# Patient Record
Sex: Male | Born: 1945 | Race: White | Hispanic: No | Marital: Single | Smoking: Former smoker
Health system: Southern US, Community
[De-identification: ages and names within clinical notes are randomized; demographics above are authoritative.]

## PROBLEM LIST (undated history)

## (undated) DIAGNOSIS — K219 Gastro-esophageal reflux disease without esophagitis: Secondary | ICD-10-CM

## (undated) DIAGNOSIS — F419 Anxiety disorder, unspecified: Secondary | ICD-10-CM

## (undated) DIAGNOSIS — E785 Hyperlipidemia, unspecified: Secondary | ICD-10-CM

## (undated) DIAGNOSIS — F319 Bipolar disorder, unspecified: Secondary | ICD-10-CM

## (undated) DIAGNOSIS — I509 Heart failure, unspecified: Secondary | ICD-10-CM

## (undated) DIAGNOSIS — G309 Alzheimer's disease, unspecified: Secondary | ICD-10-CM

## (undated) DIAGNOSIS — F0280 Dementia in other diseases classified elsewhere without behavioral disturbance: Secondary | ICD-10-CM

## (undated) DIAGNOSIS — E119 Type 2 diabetes mellitus without complications: Secondary | ICD-10-CM

## (undated) DIAGNOSIS — I10 Essential (primary) hypertension: Secondary | ICD-10-CM

---

## 2014-01-06 ENCOUNTER — Emergency Department (HOSPITAL_COMMUNITY)
Admission: EM | Admit: 2014-01-06 | Discharge: 2014-01-09 | Disposition: A | Discharge status: Other Acute Inpatient Hospital | Payer: Medicare Other | Attending: Emergency Medicine | Admitting: Emergency Medicine

## 2014-01-06 ENCOUNTER — Emergency Department (HOSPITAL_COMMUNITY): Payer: Medicare Other

## 2014-01-06 ENCOUNTER — Encounter (HOSPITAL_COMMUNITY): Payer: Self-pay | Admitting: *Deleted

## 2014-01-06 DIAGNOSIS — G309 Alzheimer's disease, unspecified: Secondary | ICD-10-CM | POA: Insufficient documentation

## 2014-01-06 DIAGNOSIS — K219 Gastro-esophageal reflux disease without esophagitis: Secondary | ICD-10-CM | POA: Insufficient documentation

## 2014-01-06 DIAGNOSIS — I10 Essential (primary) hypertension: Secondary | ICD-10-CM | POA: Diagnosis not present

## 2014-01-06 DIAGNOSIS — F419 Anxiety disorder, unspecified: Secondary | ICD-10-CM | POA: Insufficient documentation

## 2014-01-06 DIAGNOSIS — Z008 Encounter for other general examination: Secondary | ICD-10-CM

## 2014-01-06 DIAGNOSIS — E785 Hyperlipidemia, unspecified: Secondary | ICD-10-CM | POA: Diagnosis not present

## 2014-01-06 DIAGNOSIS — Z79899 Other long term (current) drug therapy: Secondary | ICD-10-CM | POA: Insufficient documentation

## 2014-01-06 DIAGNOSIS — Z7902 Long term (current) use of antithrombotics/antiplatelets: Secondary | ICD-10-CM | POA: Diagnosis not present

## 2014-01-06 DIAGNOSIS — Z7982 Long term (current) use of aspirin: Secondary | ICD-10-CM | POA: Insufficient documentation

## 2014-01-06 DIAGNOSIS — Z87891 Personal history of nicotine dependence: Secondary | ICD-10-CM | POA: Insufficient documentation

## 2014-01-06 DIAGNOSIS — E119 Type 2 diabetes mellitus without complications: Secondary | ICD-10-CM | POA: Diagnosis not present

## 2014-01-06 DIAGNOSIS — I509 Heart failure, unspecified: Secondary | ICD-10-CM | POA: Insufficient documentation

## 2014-01-06 DIAGNOSIS — F0280 Dementia in other diseases classified elsewhere without behavioral disturbance: Secondary | ICD-10-CM | POA: Diagnosis present

## 2014-01-06 DIAGNOSIS — R45851 Suicidal ideations: Secondary | ICD-10-CM | POA: Diagnosis present

## 2014-01-06 DIAGNOSIS — Q349 Congenital malformation of respiratory system, unspecified: Secondary | ICD-10-CM

## 2014-01-06 HISTORY — DX: Dementia in other diseases classified elsewhere, unspecified severity, without behavioral disturbance, psychotic disturbance, mood disturbance, and anxiety: F02.80

## 2014-01-06 HISTORY — DX: Hyperlipidemia, unspecified: E78.5

## 2014-01-06 HISTORY — DX: Type 2 diabetes mellitus without complications: E11.9

## 2014-01-06 HISTORY — DX: Anxiety disorder, unspecified: F41.9

## 2014-01-06 HISTORY — DX: Alzheimer's disease, unspecified: G30.9

## 2014-01-06 HISTORY — DX: Essential (primary) hypertension: I10

## 2014-01-06 HISTORY — DX: Gastro-esophageal reflux disease without esophagitis: K21.9

## 2014-01-06 HISTORY — DX: Heart failure, unspecified: I50.9

## 2014-01-06 LAB — CBC WITH DIFFERENTIAL/PLATELET
BASOS PCT: 0 % (ref 0–1)
Basophils Absolute: 0 10*3/uL (ref 0.0–0.1)
Eosinophils Absolute: 0.1 10*3/uL (ref 0.0–0.7)
Eosinophils Relative: 3 % (ref 0–5)
HCT: 33.5 % — ABNORMAL LOW (ref 39.0–52.0)
HEMOGLOBIN: 11.3 g/dL — AB (ref 13.0–17.0)
Lymphocytes Relative: 40 % (ref 12–46)
Lymphs Abs: 1.8 10*3/uL (ref 0.7–4.0)
MCH: 30.5 pg (ref 26.0–34.0)
MCHC: 33.7 g/dL (ref 30.0–36.0)
MCV: 90.5 fL (ref 78.0–100.0)
MONO ABS: 0.3 10*3/uL (ref 0.1–1.0)
MONOS PCT: 7 % (ref 3–12)
NEUTROS PCT: 50 % (ref 43–77)
Neutro Abs: 2.3 10*3/uL (ref 1.7–7.7)
PLATELETS: 82 10*3/uL — AB (ref 150–400)
RBC: 3.7 MIL/uL — ABNORMAL LOW (ref 4.22–5.81)
RDW: 14 % (ref 11.5–15.5)
WBC: 4.5 10*3/uL (ref 4.0–10.5)

## 2014-01-06 LAB — COMPREHENSIVE METABOLIC PANEL
ALT: 14 U/L (ref 0–53)
AST: 30 U/L (ref 0–37)
Albumin: 4.1 g/dL (ref 3.5–5.2)
Alkaline Phosphatase: 76 U/L (ref 39–117)
Anion gap: 9 (ref 5–15)
BUN: 16 mg/dL (ref 6–23)
CO2: 25 mmol/L (ref 19–32)
Calcium: 9.5 mg/dL (ref 8.4–10.5)
Chloride: 107 mEq/L (ref 96–112)
Creatinine, Ser: 0.9 mg/dL (ref 0.50–1.35)
GFR, EST NON AFRICAN AMERICAN: 86 mL/min — AB (ref >90)
Glucose, Bld: 83 mg/dL (ref 70–99)
Potassium: 3.8 mmol/L (ref 3.5–5.1)
SODIUM: 141 mmol/L (ref 135–145)
Total Bilirubin: 0.7 mg/dL (ref 0.3–1.2)
Total Protein: 7.2 g/dL (ref 6.0–8.3)

## 2014-01-06 LAB — RAPID URINE DRUG SCREEN, HOSP PERFORMED
Amphetamines: NOT DETECTED
BENZODIAZEPINES: POSITIVE — AB
Barbiturates: NOT DETECTED
Cocaine: NOT DETECTED
OPIATES: NOT DETECTED
Tetrahydrocannabinol: NOT DETECTED

## 2014-01-06 LAB — ETHANOL: Alcohol, Ethyl (B): <5 mg/dL (ref 0–9)

## 2014-01-06 MED ORDER — LORAZEPAM 1 MG PO TABS
2.0000 mg | ORAL_TABLET | Freq: Once | ORAL | Status: AC
  Administered 2014-01-06: 2 mg via ORAL
  Filled 2014-01-06: qty 2

## 2014-01-06 NOTE — ED Notes (Signed)
"  I just don't think its necessary. These tests are where you make your money" per patient when asked if he would be willing to let this writer preform an EKG.

## 2014-01-06 NOTE — BH Assessment (Addendum)
Tele Assessment Note  Justin FriskJohnny Howard is an 68 y.o. divorced male [facility] with [facility]. [facility]. However, the pt has no insight into his illness. His mood are usually labile throughout the assessment, alternating between happy, angry, and irritable. He also has a long hx of alcohol abuse but reports that he has not drunk since he [facility]. He is dressed in scrubs and maintains eye contact. Pt is very open and talks excessively about how much he hates his current residential placement and will hurt himself if he has to continue living there. He threatened to break a window and cut himself with a shard of glass. Pt was also quite upset about having a Power of Attorney, whom he states is trying to steal his half million dollars from him. Pt also discussed properties he states that he owns and says his peers at the [facility] are jealous of his wealth. There is a possibility that the pt is having grandiose delusions but TTS Counselor had no way corroborate his stories since the doctor at [facility] knew nothing about the pt's episode and none of his relatives could be reached by phone. Pt denies any A/VH or delusions and believes that he is stuck in a home "with crazy people" [facility]. No hx of past suicide attempts. No reported previous mental health treatment. Pt does acknowledge that his current living situation has led to some depression and continues to state that he cannot take living there any longer.  Primary: 331.0 Alzheimer's disease                309.0 Adjustment Disorder w/ Depressed Mood Axis II: No diagnosis Axis III: HBP, GERD, Hyperlipidemia, CHF Axis IV: housing problems, occupational problems, problems related to social environment and problems with primary support group Axis V: GAF= 35  Past Medical History:  Past Medical History  Diagnosis Date  . Hypertension   . Diabetes  mellitus without complication   . GERD (gastroesophageal reflux disease)   . Hyperlipidemia   . CHF (congestive heart failure)   . Alzheimer disease   . Anxiety     History reviewed. No pertinent past surgical history.  Family History: History reviewed. No pertinent family history.  Social History:  reports that he has quit smoking. He does not have any smokeless tobacco history on file. His alcohol and drug histories are not on file.  Additional Social History:  Alcohol / Drug Use Pain Medications: See med list Prescriptions: See med list Over the Counter: See med list History of alcohol / drug use?: Yes Longest period of sobriety (when/how long): "Many years...plus, I haven't had a single drink since I got locked up in this facility in 2014" Negative Consequences of Use:  (Denies) Withdrawal Symptoms:  (Denies) Substance #1 Name of Substance 1: Alcohol 1 - Age of First Use: 36 1 - Amount (size/oz): 1/2 bottle of Vodka 1 - Frequency: Daily for a few years; intermittently throughout other years 1 - Duration: 30 years 1 - Last Use / Amount: 2014 before [facility]  CIWA: CIWA-Ar BP: (!) 138/52 mmHg Pulse Rate: 63 COWS:    PATIENT STRENGTHS: (choose at least two) Communication skills Motivation for treatment/growth Religious Affiliation Supportive family/friends Work skills  Allergies: No Known Allergies  Home Medications:  (Not in a hospital admission)  OB/GYN Status:  No LMP for male patient.  General Assessment Data Location of Assessment: [name] Is this a Tele or Face-to-Face Assessment?: Face-to-Face Is this an Initial Assessment or a Re-assessment for this encounter?: Initial Assessment Living Arrangements: Other (Comment) [facility] [place] - [facility]) Can pt return to current living arrangement?: Yes (But says he will committ suicide if he has to) Admission Status: Voluntary Is patient capable of signing voluntary  admission?: Yes Transfer from: Nsg Home Referral Source: Other [facility] )     [facility] Living Arrangements: Other (Comment) [facility] Name of Psychiatrist: Audree Baneeter King Name of Therapist: None reported  Education Status Is patient currently in school?: No Current Grade: na Highest grade of school patient has completed: college Name of school: na Contact person: na  Risk to self with the past 6 months Suicidal Ideation: Yes-Currently Present Suicidal Intent: Yes-Currently Present Is patient at risk for suicide?: Yes Suicidal Plan?: Yes-Currently Present Specify Current Suicidal Plan: Cutting self w/glass, strangling self, etc. Access to Means: Yes Specify Access to Suicidal Means: Plans to break window for glass What has been your use of drugs/alcohol within the last 12 months?: Endorses drinking etoh 1x in2014 Previous Attempts/Gestures: No How many times?: 0 Other Self Harm Risks: None noted Triggers for Past Attempts: Other (Comment) (Trigger for current SI is wanting to [facility]) Intentional Self Injurious Behavior: None Family Suicide History: No Recent stressful life event(s): Loss (Comment) (Loss of freedom, loss of son 2 yrs ago) Persecutory voices/beliefs?: No Depression: Yes Depression Symptoms: Despondent, Insomnia, Tearfulness, Isolating, Feeling angry/irritable Substance abuse history and/or treatment for substance abuse?: Yes Suicide prevention information given to non-admitted patients: Yes  Risk to Others within the past 6 months Homicidal Ideation: No Thoughts of Harm to Others: No-Not Currently Present/Within Last 6 Months (Records state [facility]) Current Homicidal Intent: No Current Homicidal Plan: No Access to Homicidal Means: No Identified Victim: na History of harm to others?: Yes (Pt denies;pt chart [facility] Assessment of  Violence: None Noted Violent Behavior Description: None noted Does patient have access to weapons?: No Criminal Charges Pending?: No Does patient have a court date: No  Psychosis Hallucinations: None noted Delusions: Grandiose (Pt reports having millions owed to him; unsure if delusional)  Mental Status Report Appear/Hygiene: Unremarkable, In scrubs Eye Contact: Good Motor Activity: Freedom of movement Speech: Argumentative, Logical/coherent, Tangential Level of Consciousness: Quiet/awake, Irritable Mood: Other (Comment) (Labile: pleasant, irritable, argumentative, happy.Marland Kitchen.) Affect: Labile Anxiety Level: Minimal Thought Processes: Tangential Judgement: Partial Orientation: Person, Place, Situation Obsessive Compulsive Thoughts/Behaviors: Moderate (About money owed to him)  Cognitive Functioning Concentration: Fair Memory: Recent Impaired, Remote Impaired IQ: Average Insight: Poor Impulse Control: Fair Appetite: Fair Weight Loss: 0 Weight Gain: 10 Sleep: Decreased Total Hours of Sleep: 5 Vegetative Symptoms: Staying in bed  ADLScreening [facility] Assessment Services) Patient's cognitive ability adequate to safely complete daily activities?: No (Per residential staff; pt does not agree) Patient able to express need for assistance with ADLs?: Yes Independently performs ADLs?: No (Pt disagrees, but residential care home staff say he needs assistance w/ ADLs)  Prior Inpatient Therapy Prior Inpatient Therapy: Yes Prior Therapy Dates: Pt cannot recall Prior Therapy [place] Reason for Treatment: Dementia/Alzheimers  Prior Outpatient Therapy Prior Outpatient Therapy: No Prior Therapy Dates: Pt unsure Prior Therapy Facilty/Provider(s): Pt unsure Reason for Treatment: na  ADL Screening (condition at time of admission) Patient's cognitive ability adequate  to safely complete daily activities?: No (Per residential staff; pt  does not agree) Is the patient deaf or have difficulty hearing?: No Does the patient have difficulty seeing, even when wearing glasses/contacts?: No Does the patient have difficulty concentrating, remembering, or making decisions?: No Patient able to express need for assistance with ADLs?: Yes Does the patient have difficulty dressing or bathing?: No Independently performs ADLs?: No (Pt disagrees, but residential care home staff say he needs assistance w/ ADLs) Does the patient have difficulty walking or climbing stairs?: No Weakness of Legs: None Weakness of Arms/Hands: None  Home Assistive Devices/Equipment Home Assistive Devices/Equipment: None    Abuse/Neglect Assessment (Assessment to be complete while patient is alone) Physical Abuse: Denies Verbal Abuse: Denies Sexual Abuse: Denies Exploitation of patient/patient's resources: Yes, present (Comment) (Pt believes his POA is exploiting him out of his funds; Social Work involved ) Self-Neglect: Denies Possible abuse reported to:: [employer] Social Work Values / Beliefs Cultural Requests During Hospitalization: None Spiritual Requests During Hospitalization: None   Merchant navy officer (For Healthcare) Does patient have an advance directive?: No Would patient like information on creating an advanced directive?: No - patient declined information    Additional Information 1:1 In Past 12 Months?: No CIRT Risk: No Elopement Risk: No Does patient have medical clearance?: No     Disposition: Per Verne Spurr, PA pt meets inpt criteria. TTS to seek gero-psych placement, preferably in [place]. Social work also involved, as pt deemed to be able to live at a lower level of care than his current lock-down facility.   Disposition Initial Assessment Completed for this Encounter: Yes Disposition of Patient: Inpatient treatment program, Referred to Type of inpatient treatment program: Adult Patient referred to: Other (Comment)  ([facility] [place] recommended by Verne Spurr, PA)  Cyndie Mull, [facility] Triage Specialist  01/06/2014 10:43 PM

## 2014-01-06 NOTE — BHH Counselor (Signed)
Per Verne SpurrNeil Mashburn, PA, pt meets criteria for inpt treatment. TTS will seek placement at gero-psych facility, with PA's recommendation being [facility]. TTS Counselor will fax pt's clinical info packet/referral asap by early next morning (12/31).  TTS Counselor also discussed pt status with pt's RN, Gearldine BienenstockBrandy. Counselor informed RN of disposition, which can be shared with the patient at this time. [facility] until he can be transferred to gero-psych facility hopefully soon.      Cyndie MullAnna Summit Arroyave, MS, [facility] Triage Specialist

## 2014-01-06 NOTE — ED Notes (Signed)
Staffing office just notified by me for need for sitter.

## 2014-01-06 NOTE — BHH Counselor (Signed)
Pt completed behavioral health assessment with pt and then called [facility] to gain collateral information. Dr. Theron AristaPeter King's contact info was listed in Epic, however he could not provide much information about the incident. He read from pt's chart that "he [patient] became aggressive and threatened to kill himself" and was then transported to ED.    Cyndie MullAnna [place], [facility] Triage Specialist

## 2014-01-06 NOTE — ED Notes (Signed)
Per Tobi BastosAnna, TTS, patient's information will be faxed [facility] tonight for possible admission tomorrow.

## 2014-01-06 NOTE — ED Notes (Signed)
Bed: [id number] Expected date:  Expected time:  Means of arrival:  Comments: EMS elderly/ag[place]ion

## 2014-01-06 NOTE — ED Provider Notes (Signed)
CSN: [id number]     Arrival date & time 01/06/14  1410 History   First MD Initiated Contact with Patient 01/06/14 1419     Chief Complaint  Patient presents with  . Suicidal     HPI Patient presents to the emergency department from a memory care unit.  He states that he does not feel like he should be varices take care of his normal activities of daily living.  It's reported that today he threatened suicide by stating that he would break a window and cut himself.  Patient states this all worsened after an MI with out of hospital cardiac arrest one year ago.    Past Medical History  Diagnosis Date  . Hypertension   . Diabetes mellitus without complication   . GERD (gastroesophageal reflux disease)   . Hyperlipidemia   . CHF (congestive heart failure)   . Alzheimer disease   . Anxiety    History reviewed. No pertinent past surgical history. History reviewed. No pertinent family history. History  Substance Use Topics  . Smoking status: Former Games developermoker  . Smokeless tobacco: Not on file  . Alcohol Use: Not on file    Review of Systems  All other systems reviewed and are negative.     Allergies  Review of patient's allergies indicates no known allergies.  Home Medications   Prior to Admission medications   Medication Sig Start Date End Date Taking? Authorizing Provider  acetaminophen (TYLENOL) 325 MG tablet Take 650 mg by mouth every 6 (six) hours as needed for moderate pain.   Yes Historical Provider, MD  aspirin 81 MG tablet Take 81 mg by mouth daily.   Yes Historical Provider, MD  clopidogrel (PLAVIX) 75 MG tablet Take 75 mg by mouth daily.   Yes Historical Provider, MD  DOXAZOSIN MESYLATE PO Take 4 mg by mouth daily.   Yes Historical Provider, MD  ferrous sulfate 325 (65 FE) MG tablet Take 325 mg by mouth daily with breakfast.   Yes Historical Provider, MD  fluticasone (FLONASE) 50 MCG/ACT nasal spray Place 1 spray into both nostrils daily.   Yes Historical Provider, MD   loratadine (CLARITIN) 10 MG tablet Take 10 mg by mouth at bedtime and may repeat dose one time if needed.   Yes Historical Provider, MD  LORazepam (ATIVAN) 1 MG tablet Take 1 mg by mouth every 8 (eight) hours as needed for anxiety (anxiety).   Yes Historical Provider, MD  metFORMIN (GLUCOPHAGE) 500 MG tablet Take 500 mg by mouth 2 (two) times daily with a meal.   Yes Historical Provider, MD  metoprolol (LOPRESSOR) 50 MG tablet Take 50 mg by mouth 2 (two) times daily.   Yes Historical Provider, MD  nitroGLYCERIN (NITROSTAT) 0.4 MG SL tablet Place 0.4 mg under the tongue every 5 (five) minutes as needed for chest pain. For 3 Doses.   Yes Historical Provider, MD  omeprazole (PRILOSEC) 20 MG capsule Take 20 mg by mouth at bedtime.   Yes Historical Provider, MD  pravastatin (PRAVACHOL) 40 MG tablet Take 40 mg by mouth daily.   Yes Historical Provider, MD  QUEtiapine Fumarate (SEROQUEL XR) 150 MG 24 hr tablet Take 150 mg by mouth at bedtime.   Yes Historical Provider, MD  thiamine (VITAMIN B-1) 100 MG tablet Take 100 mg by mouth daily.   Yes Historical Provider, MD   BP 158/77 mmHg  Pulse 55  Temp(Src) 97.7 F (36.5 C) (Oral)  Resp 20  SpO2 96% Physical Exam  Constitutional:  He is oriented to person, place, and time. He appears well-developed and well-nourished.  HENT:  Head: Normocephalic and atraumatic.  Eyes: EOM are normal.  Neck: Normal range of motion.  Cardiovascular: Normal rate, regular rhythm, normal heart sounds and intact distal pulses.   Pulmonary/Chest: Effort normal and breath sounds normal. No respiratory distress.  Abdominal: Soft. He exhibits no distension. There is no tenderness.  Musculoskeletal: Normal range of motion.  Neurological: He is alert and oriented to person, place, and time.  Skin: Skin is warm and dry.  Psychiatric: He has a normal mood and affect. Judgment normal.  Nursing note and vitals reviewed.   ED Course  Procedures (including critical care  time) Labs Review Labs Reviewed  CBC WITH DIFFERENTIAL  COMPREHENSIVE METABOLIC PANEL  URINE RAPID DRUG SCREEN (HOSP PERFORMED)  ETHANOL    Imaging Review Dg Chest 1 View  01/06/2014   CLINICAL DATA:  Medical clearance for psychiatric admission.  EXAM: CHEST - 1 VIEW  COMPARISON:  None.  FINDINGS: Mild peribronchial thickening and hyperinflation. Heart and mediastinal contours are within normal limits. No focal opacities or effusions. No acute bony abnormality.  IMPRESSION: Mild bronchitic changes.   Electronically Signed   By: Charlett NoseKevin  Dover M.D.   On: 01/06/2014 16:54  I personally reviewed the imaging tests through PACS system I reviewed available ER/hospitalization records through the EMR    EKG Interpretation None      MDM   Final diagnoses:  Medical clearance for psychiatric admission   Overall I'm not completely convinced the patient needs to be in a full lockdown memory care unit.  I think he would do well in an assisted living unit.  I understand the patient's frustration.  Not sure this is the level of care that he needs.  I don't get a sense that he is truly suicidal at think he is just frustrated with his living situation.  I've asked that he be compliant here in the emergency department and work with us.  I will involve social work as well as case Insurance account managermanagement.  I think he will benefit from a psychiatric consultation to better evaluate his ability to live and in an assisted living facility.    Lyanne CoKevin M Edy Mcbane, MD 01/06/14 [phone]

## 2014-01-06 NOTE — ED Notes (Signed)
Justin Howard, TTS, at bedside speaking with patient.

## 2014-01-06 NOTE — Progress Notes (Signed)
CSW met with patient at bedside. There was no family present. According to patient, he presents to the ED from the locked unit at [facility] because he stated that he could not live "locked up" anymore. Patient states that he feels his quality of life is being decreased by staying in a locked facility. Patient states his ability to work, read books, and enjoy life are being taken. Patient says that staying in a locked facility is causing him to be depressed.  Patient informed CSW that a year ago he was in [place] at a hotel, and haD a heart attack in the parking lot. According to patient, his support system consist of his daughter, sister, and male friend.  Patient stated he was extremely close to his son. However, he died 5 years ago.  Patient states that his current POA is his male friend. However, patient states that he no longer wants the friend to remain his POA.   Patient informed CSW that he feels as though he is competent, and can make decisions for himself. Patient appears to be alert and oriented.  CSW will inform patient about POA documentation.  Willette Brace [phone] ED CSW 01/06/2014 5:56 PM

## 2014-01-06 NOTE — Progress Notes (Signed)
Cm consulted by Dr Patria Maneampos to discuss pt case wanting to get pt to lower level of care.  CM assisted pt to call [employer] [phone] Pt had transpose numbers The correct number is [phone]  Cell [phone]

## 2014-01-06 NOTE — ED Notes (Signed)
Pt. Refused to have blood drawn and nurse is aware.

## 2014-01-06 NOTE — BHH Counselor (Signed)
TTS Counselor called pt's attending physician, Dr. Gwendolyn GrantWalden, whom made TTS Consult referral and informed him that she would begin assessment shortly.      Cyndie MullAnna Rajendra Spiller, MS, [facility] Triage Specialist

## 2014-01-06 NOTE — ED Notes (Signed)
Trying to ask patient to give urine sample. Explained how we would be getting new labs and urine sample, etc despite his recent visit to another hospital. Patient states "theyre going to run up my bill." MD Patria ManeCampos is at bedside currently

## 2014-01-06 NOTE — Progress Notes (Signed)
CSW met with patient at beside. There was no family present. Patient was given documentation about POA. Patient informed CSW that he wishes to change his POA. However, at this current time his POA remains the same.  Patient gave CSW permission to contact his sister. Patient stated that he wanted his sister to appointed as his main [place]. However, the patients sister is not interested in being a POA at this time due to the fact that she lives in [place].  Patient also wanted CSW to speak with his daughter. CSW called daughter, but no one answered the phone.  Patient is pleasant but extremely anxious at this time. Patient appears to be impulsive and continues to walk to the nurses station.  Sister/Vera Dorian Pod [phone] Daughter/Amy Eulas Post [phone] Lennox Solders [phone]  Willette Brace [phone] ED CSW 01/06/2014 11:29 PM
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
## 2014-01-06 NOTE — BH Assessment (Signed)
Writer informed the TTS Tobi Bastosnna of the consult.

## 2014-01-06 NOTE — ED Notes (Signed)
Patient ambulating in hallway.  Alert and pleasant.  No signs of distress noted.

## 2014-01-06 NOTE — ED Notes (Signed)
Social worker at bedside.

## 2014-01-06 NOTE — ED Notes (Signed)
Patient refusing all labs and testing. Convinced [facility] is [facility]Campos aware.

## 2014-01-06 NOTE — ED Notes (Signed)
Patient is becoming increasingly agitated and impulsive.  Dr. Gwendolyn GrantWalden made aware of behavior changes.  Medications administered.

## 2014-01-06 NOTE — ED Notes (Addendum)
[facility] and today he told the nursing staff that he plans to break a window, take the glass and slit his wrist. He states he would rather die that to spend the rest of his life in a alzheimer's unit. He feels like he has been incarcerated at this facility. [facility].

## 2014-01-07 DIAGNOSIS — F0280 Dementia in other diseases classified elsewhere without behavioral disturbance: Secondary | ICD-10-CM | POA: Diagnosis present

## 2014-01-07 DIAGNOSIS — G309 Alzheimer's disease, unspecified: Secondary | ICD-10-CM

## 2014-01-07 LAB — CBG MONITORING, ED: Glucose-Capillary: 161 mg/dL — ABNORMAL HIGH (ref 70–99)

## 2014-01-07 MED ORDER — ASPIRIN 81 MG PO CHEW
81.0000 mg | CHEWABLE_TABLET | Freq: Every day | ORAL | Status: DC
  Administered 2014-01-07 – 2014-01-09 (×3): 81 mg via ORAL
  Filled 2014-01-07 (×3): qty 1

## 2014-01-07 MED ORDER — FERROUS SULFATE 325 (65 FE) MG PO TABS
325.0000 mg | ORAL_TABLET | Freq: Every day | ORAL | Status: DC
  Administered 2014-01-07 – 2014-01-09 (×3): 325 mg via ORAL
  Filled 2014-01-07 (×4): qty 1

## 2014-01-07 MED ORDER — DOXAZOSIN MESYLATE 4 MG PO TABS
4.0000 mg | ORAL_TABLET | Freq: Every day | ORAL | Status: DC
  Administered 2014-01-07 – 2014-01-09 (×3): 4 mg via ORAL
  Filled 2014-01-07 (×3): qty 1

## 2014-01-07 MED ORDER — PRAVASTATIN SODIUM 40 MG PO TABS
40.0000 mg | ORAL_TABLET | Freq: Every day | ORAL | Status: DC
  Administered 2014-01-07 – 2014-01-09 (×3): 40 mg via ORAL
  Filled 2014-01-07 (×3): qty 1

## 2014-01-07 MED ORDER — QUETIAPINE FUMARATE ER 50 MG PO TB24
150.0000 mg | ORAL_TABLET | Freq: Every day | ORAL | Status: DC
  Administered 2014-01-07 – 2014-01-08 (×2): 150 mg via ORAL
  Filled 2014-01-07 (×3): qty 3

## 2014-01-07 MED ORDER — METOPROLOL TARTRATE 25 MG PO TABS
50.0000 mg | ORAL_TABLET | Freq: Two times a day (BID) | ORAL | Status: DC
  Administered 2014-01-07 – 2014-01-09 (×5): 50 mg via ORAL
  Filled 2014-01-07 (×5): qty 2

## 2014-01-07 MED ORDER — LORATADINE 10 MG PO TABS
10.0000 mg | ORAL_TABLET | Freq: Every evening | ORAL | Status: DC | PRN
  Administered 2014-01-07 – 2014-01-08 (×2): 10 mg via ORAL
  Filled 2014-01-07 (×6): qty 1

## 2014-01-07 MED ORDER — FLUTICASONE PROPIONATE 50 MCG/ACT NA SUSP
1.0000 | Freq: Every day | NASAL | Status: DC
  Administered 2014-01-07 – 2014-01-09 (×3): 1 via NASAL
  Filled 2014-01-07: qty 16

## 2014-01-07 MED ORDER — NITROGLYCERIN 0.4 MG SL SUBL: 0.4000 mg | SUBLINGUAL_TABLET | SUBLINGUAL | Status: DC | PRN

## 2014-01-07 MED ORDER — VITAMIN B-1 100 MG PO TABS
100.0000 mg | ORAL_TABLET | Freq: Every day | ORAL | Status: DC
  Administered 2014-01-07 – 2014-01-09 (×3): 100 mg via ORAL
  Filled 2014-01-07 (×3): qty 1

## 2014-01-07 MED ORDER — LORAZEPAM 1 MG PO TABS
1.0000 mg | ORAL_TABLET | Freq: Three times a day (TID) | ORAL | Status: DC | PRN
  Administered 2014-01-07: 1 mg via ORAL
  Filled 2014-01-07 (×2): qty 1

## 2014-01-07 MED ORDER — PANTOPRAZOLE SODIUM 40 MG PO TBEC
40.0000 mg | DELAYED_RELEASE_TABLET | Freq: Every day | ORAL | Status: DC
  Administered 2014-01-07 – 2014-01-09 (×3): 40 mg via ORAL
  Filled 2014-01-07 (×3): qty 1

## 2014-01-07 MED ORDER — CLOPIDOGREL BISULFATE 75 MG PO TABS
75.0000 mg | ORAL_TABLET | Freq: Every day | ORAL | Status: DC
  Administered 2014-01-07 – 2014-01-09 (×3): 75 mg via ORAL
  Filled 2014-01-07 (×3): qty 1

## 2014-01-07 MED ORDER — METFORMIN HCL 500 MG PO TABS
500.0000 mg | ORAL_TABLET | Freq: Two times a day (BID) | ORAL | Status: DC
  Administered 2014-01-07 – 2014-01-09 (×5): 500 mg via ORAL
  Filled 2014-01-07 (×6): qty 1

## 2014-01-07 NOTE — ED Notes (Signed)
Patient was awake briefly, pleasant with no distress noted.  Ambulated to restroom without difficulty.  He requested something to drink, but was asleep within a few minutes of getting back to his room.

## 2014-01-07 NOTE — ED Notes (Signed)
Pt out to nurses station to use phone, calling facility he came from.

## 2014-01-07 NOTE — Progress Notes (Signed)
Patients sister reached out to [facility]. CSW met with patient in room and facilitated a phone call between patient and sister. Patient and sister are curious as to where he will be discharged. Patient was pleasant and seems less anxious than yesterday.  Willette Brace [phone] ED CSW 01/07/2014 11:17 PM

## 2014-01-07 NOTE — BH Assessment (Signed)
BHH Assessment Progress Note  Pt has been referred to the following facilities with results as noted:  *[facility]: referral faxed, decision pending Berton Lan*[facility]: referral faxed, decision pending Joanne Gavel*[facility]: at capacity *[facility]: at capacity [facility]: at capacity [employer]*[facility]: at capacity  Doylene Canninghomas Tivis Wherry, [place] Triage Specialist 01/07/2014 @ 16:51

## 2014-01-07 NOTE — ED Notes (Signed)
Bed: WA27 Expected date:  Expected time:  Means of arrival:  Comments: Claudette LawsWatson

## 2014-01-07 NOTE — Progress Notes (Signed)
01/07/14 1610 ED CM spoke with pt.  He was sitting on his bed quietly but had various questions about his progression.  Pt inquired about different levels of care Pt initially did not recognize Cm until she came closer to him.  Then he inquired about the difference between CM and SW jobs Pt called SW [name] by name without being reminded of her name by CM.  Pt states he spoke with his lawyer today Unconfirmed by CM.  Did not confirm if he has spoken with [name].  ED Cm and SW spoke and discussed call to pt's sister on 01/06/14 for collateral information.  Refer to ED SW note from 01/06/14.  Pt voiced interest in having "intellectual" things to read like "forbes", "wall [employer]"  Reports being use to his "laptop"  Pt noted confused about a "level one here" ([name]) and [facility].  Explained to pt that disposition is pending a bed offer from an inpatient or geriatric facility as  re[facility] after evaluating him 01/07/14 Pt voiced understanding. CM discussed with [name], pt request to have something to read or do "intellectually"

## 2014-01-07 NOTE — ED Notes (Signed)
Pt out to nurses station to use phone. Pt called his facility and his attorney. Was stating on the phone that he is so locked up at the facility that he" doesn't want to live anymore, I didn't say the word "cut" but that's my plan." Pt is focused on his POA status and wanting SW here to change his medical and financial POA as he feels his current POA and girlfriend is only after his money and feels as though he can makes medical decisions for himself. Sts he has never been declared incompetent or incapacitated. Pt sts he wants the psychiatrist rounding this am to speak with his attorney by 12pm regarding POA. Pt advised that the ED's primary goal is to determine if the pt is safe to be discharged back to [facility], or if he requires inpt treatment. Pt frustrated, focusing on POA issues again, but acknowledges plan of care and agreement. Pt back to room with sitter.

## 2014-01-07 NOTE — ED Notes (Signed)
TTS at bedside. 

## 2014-01-07 NOTE — ED Notes (Signed)
Patient is finally asleep, will report off to on coming tech that vitals need to be done when patient wakes up. RN notified and aware.

## 2014-01-07 NOTE — ED Notes (Signed)
TTS AT BS

## 2014-01-07 NOTE — Progress Notes (Signed)
ED CM received a call left by Bartholomew BoardsAnn Thornley [phone]) on 01/07/14 at 1119 Stating she appreciating Cm speaking with her and son on 01/06/14 and wanted to know if Cm needed her to fax the [facility]iatric report [facility] [address] [place] Stated CM would be able to reached via her cell [phone]  [phone] Cm returned a call [facility] to [facility] and informed her that the [facility] did not request the report and have evaluated pt.  She informed CM the Dr who saw the pt was Dr Sherral Hammersobbins She asked how the pt was doing and was informed that the last time CM saw pt he was doing well and eating his lunch.  Dewayne Hatchnn stated she just wanted to know how pt was doing CM did not offer further information
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
## 2014-01-07 NOTE — ED Notes (Addendum)
Pt requests Ativan to be given around 9 pm so he can sleep better.

## 2014-01-08 DIAGNOSIS — Z79899 Other long term (current) drug therapy: Secondary | ICD-10-CM | POA: Diagnosis not present

## 2014-01-08 DIAGNOSIS — K219 Gastro-esophageal reflux disease without esophagitis: Secondary | ICD-10-CM | POA: Diagnosis not present

## 2014-01-08 DIAGNOSIS — G309 Alzheimer's disease, unspecified: Secondary | ICD-10-CM | POA: Diagnosis not present

## 2014-01-08 DIAGNOSIS — F419 Anxiety disorder, unspecified: Secondary | ICD-10-CM | POA: Diagnosis not present

## 2014-01-08 DIAGNOSIS — E119 Type 2 diabetes mellitus without complications: Secondary | ICD-10-CM | POA: Diagnosis not present

## 2014-01-08 DIAGNOSIS — F0280 Dementia in other diseases classified elsewhere without behavioral disturbance: Secondary | ICD-10-CM | POA: Diagnosis not present

## 2014-01-08 DIAGNOSIS — I509 Heart failure, unspecified: Secondary | ICD-10-CM | POA: Diagnosis not present

## 2014-01-08 DIAGNOSIS — Z7902 Long term (current) use of antithrombotics/antiplatelets: Secondary | ICD-10-CM | POA: Diagnosis not present

## 2014-01-08 DIAGNOSIS — Z87891 Personal history of nicotine dependence: Secondary | ICD-10-CM | POA: Diagnosis not present

## 2014-01-08 DIAGNOSIS — Z7982 Long term (current) use of aspirin: Secondary | ICD-10-CM | POA: Diagnosis not present

## 2014-01-08 DIAGNOSIS — E785 Hyperlipidemia, unspecified: Secondary | ICD-10-CM | POA: Diagnosis not present

## 2014-01-08 DIAGNOSIS — I10 Essential (primary) hypertension: Secondary | ICD-10-CM | POA: Diagnosis not present

## 2014-01-08 DIAGNOSIS — R45851 Suicidal ideations: Secondary | ICD-10-CM | POA: Diagnosis present

## 2014-01-08 LAB — CBG MONITORING, ED
GLUCOSE-CAPILLARY: 160 mg/dL — AB (ref 70–99)
GLUCOSE-CAPILLARY: 171 mg/dL — AB (ref 70–99)
Glucose-Capillary: 133 mg/dL — ABNORMAL HIGH (ref 70–99)
Glucose-Capillary: 104 mg/dL — ABNORMAL HIGH (ref 70–99)

## 2014-01-08 NOTE — ED Notes (Signed)
Pt ate 100% of his breakfast.

## 2014-01-08 NOTE — BH Assessment (Signed)
Dr. Thedore Mins recommends referral to geriatric-psychiatry unit. Contacted the following facilities for placement:  PENDING DECISION: [facility]  BED AVAILABLE, FAXED CLINICAL INFORMATION: [facility], per Amy  AT CAPACITY: [facility], per [facility], per [facility], per [facility], per Abigail  NO RESPONSE: [facility]   [address] [place], [place], [facility] Triage Specialist [phone]
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
## 2014-01-08 NOTE — BH Assessment (Signed)
[facility] Assessment Progress Note  The following attempts have been made to place this pt with results as noted:  *[facility]: at capacity *[facility]: at capacity *[facility]: at capacity *[facility]: declined due to diagnosis of dementia *[facility]: beds available; referral sent with decision pending *[facility]: beds available; referral sent with decision pending *[facility]: beds available; referral sent with decision pending *[facility]: beds available; referral sent with decision pending  Doylene Canning, MA Triage Specialist 01/08/2014 @ 16:30

## 2014-01-08 NOTE — ED Notes (Signed)
Pt alert x4, no c/o pain, resting, Sitter in reach of the Pt will continue to monitor. Estill Dooms, RN 01/08/2014 8:59 PM

## 2014-01-08 NOTE — ED Notes (Signed)
Pt ate 75% of his lunch 

## 2014-01-09 ENCOUNTER — Emergency Department (HOSPITAL_COMMUNITY): Payer: Medicare Other

## 2014-01-09 DIAGNOSIS — G309 Alzheimer's disease, unspecified: Secondary | ICD-10-CM

## 2014-01-09 DIAGNOSIS — R45851 Suicidal ideations: Secondary | ICD-10-CM

## 2014-01-09 DIAGNOSIS — F4321 Adjustment disorder with depressed mood: Secondary | ICD-10-CM

## 2014-01-09 DIAGNOSIS — F0280 Dementia in other diseases classified elsewhere without behavioral disturbance: Secondary | ICD-10-CM

## 2014-01-09 LAB — CBG MONITORING, ED
GLUCOSE-CAPILLARY: 104 mg/dL — AB (ref 70–99)
GLUCOSE-CAPILLARY: 176 mg/dL — AB (ref 70–99)
GLUCOSE-CAPILLARY: 192 mg/dL — AB (ref 70–99)

## 2014-01-09 LAB — BASIC METABOLIC PANEL
Anion gap: 7 (ref 5–15)
BUN: 21 mg/dL (ref 6–23)
CO2: 25 mmol/L (ref 19–32)
CREATININE: 1 mg/dL (ref 0.50–1.35)
Calcium: 9.1 mg/dL (ref 8.4–10.5)
Chloride: 106 mEq/L (ref 96–112)
GFR calc Af Amer: 88 mL/min — ABNORMAL LOW (ref >90)
GFR calc non Af Amer: 76 mL/min — ABNORMAL LOW (ref >90)
GLUCOSE: 176 mg/dL — AB (ref 70–99)
POTASSIUM: 3.5 mmol/L (ref 3.5–5.1)
Sodium: 138 mmol/L (ref 135–145)

## 2014-01-09 NOTE — ED Notes (Signed)
Patient transported to X-ray 

## 2014-01-09 NOTE — Progress Notes (Signed)
5:09pm. CSW spoke with pt's Jenean Lindau [phone]) to inform of transfer and provided [facility]'s contact information. CSW answered poa's question. CSW also informed Laqueta Linden that pt's sister Dwana Curd, [phone]) had called [facility] asking for information, but that CSW had been unable to provide sister information due to HIPAA.   Mariann Laster,     ED CSW  phone: [phone]
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
## 2014-01-09 NOTE — Progress Notes (Signed)
2:08pm. CSW faxed paperwork to Magistrate. Per [employer], paperwork approved and sheriff to be dispatched.  Mariann Laster,     ED CSW  phone: [phone]

## 2014-01-09 NOTE — ED Notes (Signed)
Spoke to [place] at [facility] she states patient bed is ready/available [address].  Notified sheriff office via voicemail awaiting return call.

## 2014-01-09 NOTE — Progress Notes (Signed)
2:18pm. Pt has been accepted to [facility] by Dr. Lowanda Foster. Report can be called after 7pm and will require sheriff IVC transport.  Call report to [phone].  Mariann Laster,     ED CSW  phone: [phone]

## 2014-01-09 NOTE — Progress Notes (Signed)
Called Sheriff's office to notify them that transportation will be needed to [facility] after 1900 today. Sheriff stated if Justin Howard is not able to take the patient at this time, transportation will have to be deferred to 0700 on 01/10/2014. [facility] notified that transportation will occur in am.

## 2014-01-09 NOTE — BH Assessment (Signed)
Dr. Thedore Mins recommends referral to geriatric-psychiatry unit. Contacted the following facilities for placement:  PENDING DECISION: [facility]  BED AVAILABLE, FAXED CLINICAL INFORMATION: [facility], per Cammy Copa  AT CAPACITY: [facility] [place] [facility], per [facility], per [facility], per [facility], per [place]  PT DECLINED: Yvetta Coder  NO RESPONSE: [facility]   [address] [place], [place], [facility] Triage Specialist [phone]

## 2014-03-09 ENCOUNTER — Other Ambulatory Visit (HOSPITAL_COMMUNITY): Payer: Self-pay | Admitting: Family Medicine

## 2014-03-09 DIAGNOSIS — R531 Weakness: Secondary | ICD-10-CM

## 2014-03-12 ENCOUNTER — Ambulatory Visit (HOSPITAL_COMMUNITY): Payer: Medicare Other

## 2014-03-12 ENCOUNTER — Ambulatory Visit (HOSPITAL_COMMUNITY)
Admission: RE | Admit: 2014-03-12 | Discharge: 2014-03-12 | Disposition: A | Discharge status: Home / Self Care | Payer: Medicare Other | Source: Ambulatory Visit | Attending: Family Medicine | Admitting: Family Medicine

## 2014-03-12 DIAGNOSIS — G319 Degenerative disease of nervous system, unspecified: Secondary | ICD-10-CM | POA: Diagnosis not present

## 2014-03-12 DIAGNOSIS — R531 Weakness: Secondary | ICD-10-CM | POA: Insufficient documentation

## 2014-11-10 ENCOUNTER — Encounter: Payer: Self-pay | Admitting: Emergency Medicine

## 2014-11-10 ENCOUNTER — Emergency Department
Admission: EM | Admit: 2014-11-10 | Discharge: 2014-11-24 | Disposition: A | Discharge status: Other Acute Inpatient Hospital | Payer: Medicare Other | Attending: Emergency Medicine | Admitting: Emergency Medicine

## 2014-11-10 DIAGNOSIS — F911 Conduct disorder, childhood-onset type: Secondary | ICD-10-CM | POA: Insufficient documentation

## 2014-11-10 DIAGNOSIS — Y9389 Activity, other specified: Secondary | ICD-10-CM | POA: Diagnosis not present

## 2014-11-10 DIAGNOSIS — E119 Type 2 diabetes mellitus without complications: Secondary | ICD-10-CM | POA: Diagnosis not present

## 2014-11-10 DIAGNOSIS — F312 Bipolar disorder, current episode manic severe with psychotic features: Secondary | ICD-10-CM

## 2014-11-10 DIAGNOSIS — X58XXXA Exposure to other specified factors, initial encounter: Secondary | ICD-10-CM | POA: Insufficient documentation

## 2014-11-10 DIAGNOSIS — I10 Essential (primary) hypertension: Secondary | ICD-10-CM

## 2014-11-10 DIAGNOSIS — S51812A Laceration without foreign body of left forearm, initial encounter: Secondary | ICD-10-CM | POA: Insufficient documentation

## 2014-11-10 DIAGNOSIS — Z87891 Personal history of nicotine dependence: Secondary | ICD-10-CM | POA: Diagnosis not present

## 2014-11-10 DIAGNOSIS — R4689 Other symptoms and signs involving appearance and behavior: Secondary | ICD-10-CM

## 2014-11-10 DIAGNOSIS — S51811A Laceration without foreign body of right forearm, initial encounter: Secondary | ICD-10-CM | POA: Insufficient documentation

## 2014-11-10 DIAGNOSIS — Y9289 Other specified places as the place of occurrence of the external cause: Secondary | ICD-10-CM | POA: Diagnosis not present

## 2014-11-10 DIAGNOSIS — F0280 Dementia in other diseases classified elsewhere without behavioral disturbance: Secondary | ICD-10-CM | POA: Diagnosis not present

## 2014-11-10 DIAGNOSIS — Z7982 Long term (current) use of aspirin: Secondary | ICD-10-CM | POA: Insufficient documentation

## 2014-11-10 DIAGNOSIS — G309 Alzheimer's disease, unspecified: Secondary | ICD-10-CM | POA: Diagnosis not present

## 2014-11-10 DIAGNOSIS — Y998 Other external cause status: Secondary | ICD-10-CM | POA: Diagnosis not present

## 2014-11-10 DIAGNOSIS — Z794 Long term (current) use of insulin: Secondary | ICD-10-CM | POA: Insufficient documentation

## 2014-11-10 DIAGNOSIS — Z79899 Other long term (current) drug therapy: Secondary | ICD-10-CM | POA: Insufficient documentation

## 2014-11-10 DIAGNOSIS — G931 Anoxic brain damage, not elsewhere classified: Secondary | ICD-10-CM

## 2014-11-10 DIAGNOSIS — Y92129 Unspecified place in nursing home as the place of occurrence of the external cause: Secondary | ICD-10-CM | POA: Diagnosis not present

## 2014-11-10 HISTORY — DX: Bipolar disorder, unspecified: F31.9

## 2014-11-10 LAB — URINALYSIS COMPLETE WITH MICROSCOPIC (ARMC ONLY)
BACTERIA UA: NONE SEEN
Bilirubin Urine: NEGATIVE
GLUCOSE, UA: NEGATIVE mg/dL
HGB URINE DIPSTICK: NEGATIVE
Ketones, ur: NEGATIVE mg/dL
LEUKOCYTES UA: NEGATIVE
NITRITE: NEGATIVE
PH: 6 (ref 5.0–8.0)
Protein, ur: NEGATIVE mg/dL
SPECIFIC GRAVITY, URINE: 1.008 (ref 1.005–1.030)

## 2014-11-10 LAB — CBC
HCT: 23.9 % — ABNORMAL LOW (ref 40.0–52.0)
Hemoglobin: 7.9 g/dL — ABNORMAL LOW (ref 13.0–18.0)
MCH: 30 pg (ref 26.0–34.0)
MCHC: 33.1 g/dL (ref 32.0–36.0)
MCV: 90.6 fL (ref 80.0–100.0)
PLATELETS: 143 10*3/uL — AB (ref 150–440)
RBC: 2.64 MIL/uL — AB (ref 4.40–5.90)
RDW: 16.5 % — ABNORMAL HIGH (ref 11.5–14.5)
WBC: 4.5 10*3/uL (ref 3.8–10.6)

## 2014-11-10 LAB — ACETAMINOPHEN LEVEL: Acetaminophen (Tylenol), Serum: <10 ug/mL — ABNORMAL LOW (ref 10–30)

## 2014-11-10 LAB — URINE DRUG SCREEN, QUALITATIVE (ARMC ONLY)
AMPHETAMINES, UR SCREEN: NOT DETECTED
BARBITURATES, UR SCREEN: NOT DETECTED
BENZODIAZEPINE, UR SCRN: POSITIVE — AB
Cannabinoid 50 Ng, Ur ~~LOC~~: NOT DETECTED
Cocaine Metabolite,Ur ~~LOC~~: NOT DETECTED
MDMA (Ecstasy)Ur Screen: NOT DETECTED
METHADONE SCREEN, URINE: NOT DETECTED
OPIATE, UR SCREEN: NOT DETECTED
Phencyclidine (PCP) Ur S: NOT DETECTED
TRICYCLIC, UR SCREEN: NOT DETECTED

## 2014-11-10 LAB — COMPREHENSIVE METABOLIC PANEL
ALT: 27 U/L (ref 17–63)
AST: 37 U/L (ref 15–41)
Albumin: 3.5 g/dL (ref 3.5–5.0)
Alkaline Phosphatase: 59 U/L (ref 38–126)
Anion gap: 8 (ref 5–15)
BILIRUBIN TOTAL: 0.8 mg/dL (ref 0.3–1.2)
BUN: 11 mg/dL (ref 6–20)
CO2: 27 mmol/L (ref 22–32)
CREATININE: 1.19 mg/dL (ref 0.61–1.24)
Calcium: 9 mg/dL (ref 8.9–10.3)
Chloride: 105 mmol/L (ref 101–111)
Glucose, Bld: 115 mg/dL — ABNORMAL HIGH (ref 65–99)
Potassium: 4.3 mmol/L (ref 3.5–5.1)
Sodium: 140 mmol/L (ref 135–145)
TOTAL PROTEIN: 6.3 g/dL — AB (ref 6.5–8.1)

## 2014-11-10 LAB — SALICYLATE LEVEL

## 2014-11-10 LAB — ETHANOL: Alcohol, Ethyl (B): <5 mg/dL (ref <5)

## 2014-11-10 MED ORDER — LORAZEPAM 2 MG PO TABS
2.0000 mg | ORAL_TABLET | Freq: Once | ORAL | Status: AC
  Administered 2014-11-10: 2 mg via ORAL

## 2014-11-10 MED ORDER — METFORMIN HCL 500 MG PO TABS
500.0000 mg | ORAL_TABLET | Freq: Two times a day (BID) | ORAL | Status: DC
  Administered 2014-11-10 – 2014-11-24 (×21): 500 mg via ORAL
  Filled 2014-11-10 (×10): qty 1

## 2014-11-10 MED ORDER — PANTOPRAZOLE SODIUM 40 MG PO TBEC
40.0000 mg | DELAYED_RELEASE_TABLET | Freq: Every day | ORAL | Status: DC
  Administered 2014-11-10 – 2014-11-24 (×14): 40 mg via ORAL
  Filled 2014-11-10 (×7): qty 1

## 2014-11-10 MED ORDER — ASPIRIN EC 81 MG PO TBEC
81.0000 mg | DELAYED_RELEASE_TABLET | Freq: Every day | ORAL | Status: DC
  Administered 2014-11-10 – 2014-11-24 (×14): 81 mg via ORAL
  Filled 2014-11-10 (×7): qty 1

## 2014-11-10 MED ORDER — METOPROLOL TARTRATE 25 MG PO TABS
25.0000 mg | ORAL_TABLET | Freq: Two times a day (BID) | ORAL | Status: DC
  Administered 2014-11-10 – 2014-11-24 (×25): 25 mg via ORAL
  Filled 2014-11-10 (×12): qty 1

## 2014-11-10 MED ORDER — VITAMIN B-1 100 MG PO TABS
100.0000 mg | ORAL_TABLET | Freq: Every day | ORAL | Status: DC
  Administered 2014-11-10 – 2014-11-24 (×13): 100 mg via ORAL
  Filled 2014-11-10 (×5): qty 1

## 2014-11-10 MED ORDER — FERROUS SULFATE 325 (65 FE) MG PO TABS
325.0000 mg | ORAL_TABLET | Freq: Every day | ORAL | Status: DC
  Administered 2014-11-11 – 2014-11-24 (×13): 325 mg via ORAL
  Filled 2014-11-10 (×11): qty 1

## 2014-11-10 MED ORDER — SACUBITRIL-VALSARTAN 49-51 MG PO TABS
1.0000 | ORAL_TABLET | Freq: Two times a day (BID) | ORAL | Status: DC
  Administered 2014-11-10 – 2014-11-24 (×24): 1 via ORAL
  Filled 2014-11-10 (×18): qty 1

## 2014-11-10 MED ORDER — CARBAMAZEPINE ER 200 MG PO TB12
200.0000 mg | ORAL_TABLET | Freq: Two times a day (BID) | ORAL | Status: DC
  Administered 2014-11-10 – 2014-11-17 (×13): 200 mg via ORAL
  Filled 2014-11-10 (×17): qty 1

## 2014-11-10 MED ORDER — OLANZAPINE 10 MG PO TABS
10.0000 mg | ORAL_TABLET | Freq: Two times a day (BID) | ORAL | Status: DC
  Administered 2014-11-10 – 2014-11-11 (×2): 10 mg via ORAL
  Filled 2014-11-10 (×2): qty 1

## 2014-11-10 MED ORDER — CLOPIDOGREL BISULFATE 75 MG PO TABS
75.0000 mg | ORAL_TABLET | Freq: Every day | ORAL | Status: DC
  Administered 2014-11-10 – 2014-11-24 (×14): 75 mg via ORAL
  Filled 2014-11-10 (×7): qty 1

## 2014-11-10 MED ORDER — LORAZEPAM 2 MG PO TABS
ORAL_TABLET | ORAL | Status: AC
  Filled 2014-11-10: qty 1

## 2014-11-10 MED ORDER — LORATADINE 10 MG PO TABS
10.0000 mg | ORAL_TABLET | Freq: Every day | ORAL | Status: DC
  Administered 2014-11-10 – 2014-11-24 (×14): 10 mg via ORAL
  Filled 2014-11-10 (×10): qty 1

## 2014-11-10 NOTE — ED Notes (Signed)
ED BHU PLACEMENT JUSTIFICATION Is the patient under IVC or is there intent for IVC: Yes.   Is the patient medically cleared: No. Is there vacancy in the ED BHU: Yes.   Is the population mix appropriate for patient: No. Is the patient awaiting placement in inpatient or outpatient setting: No. Has the patient had a psychiatric consult: No. Survey of unit performed for contraband, proper placement and condition of furniture, tampering with fixtures in bathroom, shower, and each patient room: Yes.  ; Findings:  APPEARANCE/BEHAVIOR calm NEURO ASSESSMENT Orientation: person Hallucinations: No.None noted (Hallucinations) Speech: Normal Gait: unsteady RESPIRATORY ASSESSMENT Normal expansion.  Clear to auscultation.  No rales, rhonchi, or wheezing. CARDIOVASCULAR ASSESSMENT regular rate and rhythm, S1, S2 normal, no murmur, click, rub or gallop GASTROINTESTINAL ASSESSMENT soft, nontender, BS WNL, no r/g EXTREMITIES normal strength, tone, and muscle mass PLAN OF CARE Provide calm/safe environment. Vital signs assessed twice daily. ED BHU Assessment once each 12-hour shift. Collaborate with intake RN daily or as condition indicates. Assure the ED provider has rounded once each shift. Provide and encourage hygiene. Provide redirection as needed. Assess for escalating behavior; address immediately and inform ED provider.  Assess family dynamic and appropriateness for visitation as needed: Yes.  ; If necessary, describe findings:  Educate the patient/family about BHU procedures/visitation: Yes.  ; If necessary, describe findings:

## 2014-11-10 NOTE — ED Notes (Signed)
BEHAVIORAL HEALTH ROUNDING Patient sleeping: No. Patient alert and oriented: no Behavior appropriate: Yes.  ; If no, describe:  Nutrition and fluids offered: Yes  Toileting and hygiene offered: Yes  Sitter present: not applicable Law enforcement present: Yes  
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
## 2014-11-10 NOTE — ED Notes (Signed)
BEHAVIORAL HEALTH ROUNDING Patient sleeping: No. Patient alert and oriented: yes Behavior appropriate: Yes.  ; If no, describe:  Nutrition and fluids offered: Yes  Toileting and hygiene offered: Yes  Sitter present: not applicable Law enforcement present: Yes  

## 2014-11-10 NOTE — ED Notes (Signed)
Patient assigned to appropriate care area. Patient oriented to unit/care area: Informed that, for their safety, care areas are designed for safety and monitored by security cameras at all times; and visiting hours explained to patient. Patient confused at this time
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
## 2014-11-10 NOTE — ED Notes (Signed)
BEHAVIORAL HEALTH ROUNDING Patient sleeping: No. Patient alert and oriented: no Behavior appropriate: No.; If no, describe: pt confused Nutrition and fluids offered: Yes  Toileting and hygiene offered: Yes  Sitter present: not applicable Law enforcement present: Yes

## 2014-11-10 NOTE — ED Notes (Signed)
TTS at bedside evaluating pt at this time.

## 2014-11-10 NOTE — ED Notes (Signed)

## 2014-11-11 ENCOUNTER — Emergency Department: Payer: Medicare Other

## 2014-11-11 MED ORDER — HALOPERIDOL LACTATE 5 MG/ML IJ SOLN
5.0000 mg | Freq: Once | INTRAMUSCULAR | Status: AC
  Administered 2014-11-11: 5 mg via INTRAMUSCULAR

## 2014-11-11 MED ORDER — QUETIAPINE FUMARATE 25 MG PO TABS: 50.0000 mg | ORAL_TABLET | ORAL | Status: DC

## 2014-11-11 MED ORDER — CARBAMAZEPINE 200 MG PO TABS
ORAL_TABLET | ORAL | Status: AC
  Filled 2014-11-11: qty 1

## 2014-11-11 MED ORDER — LORAZEPAM 1 MG PO TABS
1.0000 mg | ORAL_TABLET | Freq: Once | ORAL | Status: AC
  Administered 2014-11-11: 1 mg via ORAL
  Filled 2014-11-11: qty 1

## 2014-11-11 MED ORDER — QUETIAPINE FUMARATE 200 MG PO TABS
200.0000 mg | ORAL_TABLET | Freq: Every day | ORAL | Status: DC
  Administered 2014-11-12 – 2014-11-23 (×11): 200 mg via ORAL
  Filled 2014-11-11 (×6): qty 1

## 2014-11-11 MED ORDER — HALOPERIDOL LACTATE 5 MG/ML IJ SOLN
INTRAMUSCULAR | Status: AC
  Administered 2014-11-11: 5 mg via INTRAMUSCULAR
  Filled 2014-11-11: qty 1

## 2014-11-11 MED ORDER — QUETIAPINE FUMARATE 25 MG PO TABS: 100.0000 mg | ORAL_TABLET | Freq: Every day | ORAL | Status: DC

## 2014-11-11 NOTE — ED Provider Notes (Signed)
-----------------------------------------   2:50 AM on 11/11/2014 -----------------------------------------   Blood pressure 144/101, pulse 84, temperature 98.6 F (37 C), temperature source Oral, resp. rate 16, height 6\' 1"  (1.854 m), weight 220 lb (99.791 kg), SpO2 96 %.  The patient had no acute events since last update.  Calm and cooperative at this time.  Disposition is pending per Psychiatry/Behavioral Medicine team recommendations.  Dr. Toni Amendlapacs has seen and evaluated and was advising placement attempt with geriatric psych.     Sharyn CreamerMark Champagne Paletta, MD 11/11/14 [phone]
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
## 2014-11-11 NOTE — ED Provider Notes (Addendum)
-----------------------------------------   5:04 PM on 11/11/2014 -----------------------------------------  I was called to the bedside because the patient had a witnessed slip and fall without head injury in his room. This was witnessed by ED tech Judeth CornfieldStephanie. There was a small area of either juice or water on the floor of his room. It appears he slipped on that. He denies any pain complaints. There was no loss of consciousness. His exam at this time is atraumatic and he has no pain complaints. Head is atraumatic. No midline C/T/L-spine tenderness to palpation. No tenderness or deformity throughout the chest,abdomen or pelvis. Still ambulatory. We'll continue to monitor.  ----------------------------------------- 8:59 PM on 11/11/2014 -----------------------------------------  At this time, the patient has become somewhat agitated, requiring Ativan. He is likely experiencing sundowning. However, he also jumped out of his bed and is again fallen and hit his head. He is now on a mattress on the ground for his safety, the bed platform has been removed, will give Ativan for agitation and obtain CT head to rule out any acute traumatic intracranial abnormality.  ----------------------------------------- 11:10 PM on 11/11/2014 ----------------------------------------- CT head negative for any acute intracranial process.   Gayla DossEryka A Reza Crymes, MD 11/11/14 [phone]  Gayla DossEryka A Latria Mccarron, MD 11/11/14 2356

## 2014-11-11 NOTE — ED Notes (Signed)
BEHAVIORAL HEALTH ROUNDING Patient sleeping: No. Patient alert and oriented: yes Behavior appropriate: Yes.  ; If no, describe: pt very restless and continues to attempt to leave room Nutrition and fluids offered: Yes  Toileting and hygiene offered: Yes  Sitter present: no Law enforcement present: Yes

## 2014-11-11 NOTE — ED Notes (Signed)
Pt given breakfast tray

## 2014-11-11 NOTE — ED Notes (Signed)
BEHAVIORAL HEALTH ROUNDING Patient sleeping: No. Patient alert and oriented: yes Behavior appropriate: Yes.  ; If no, describe:  Nutrition and fluids offered: Yes  Toileting and hygiene offered: Yes  Sitter present: no Law enforcement present: Yes  

## 2014-11-11 NOTE — ED Notes (Signed)
Pt fell; Dr. Inocencio HomesGayle notfied. MD performed examination of patient. No immediate injuries noted.

## 2014-11-11 NOTE — ED Notes (Signed)
Medical poa visited pt, as well as her son. [facility]; requests info when it becomes available. Bartholomew Boards(Ann Thornley)

## 2014-11-11 NOTE — ED Notes (Addendum)
Please notify [facility] with update when available. Amy V, LPN at [phone].

## 2014-11-11 NOTE — ED Notes (Signed)
[facility]; pt in his room and resting in his bed; per Butch, pt has not slept all night.

## 2014-11-11 NOTE — ED Notes (Signed)
Pt to CT with ED Avie Echevariaech Thanh and ODS officer

## 2014-11-11 NOTE — ED Notes (Signed)

## 2014-11-11 NOTE — ED Notes (Signed)
Attempted to contact Bartholomew BoardsAnn Thornley, emergency contact and POA to inform her about pt's fall. Left message for her to return call to the ED.
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
## 2014-11-11 NOTE — ED Notes (Signed)
Pt kicked legs and rolled over head of stretcher, landing on his back on the floor. Dr Inocencio HomesGayle notified of incident.  Stretcher now removed from pt area in room, pt placed on safety mats on the floor. Mats placed to cover floor area of side B in room 22.  Pt denies any injury. Pt is still kicking his legs in the air while on mats on floor. D/t this being pt's second fall today, Dr Inocencio HomesGayle order CT of head. Pt given 1 mg of Ativan PO at 2044. Pt to go to CT after he has become less agitated.

## 2014-11-12 ENCOUNTER — Encounter: Payer: Self-pay | Admitting: *Deleted

## 2014-11-12 MED ORDER — LORAZEPAM 2 MG/ML IJ SOLN
2.0000 mg | Freq: Once | INTRAMUSCULAR | Status: AC
  Administered 2014-11-12: 2 mg via INTRAVENOUS
  Filled 2014-11-12: qty 1

## 2014-11-12 MED ORDER — HALOPERIDOL LACTATE 5 MG/ML IJ SOLN
5.0000 mg | Freq: Once | INTRAMUSCULAR | Status: AC
  Administered 2014-11-12: 5 mg via INTRAMUSCULAR
  Filled 2014-11-12: qty 1

## 2014-11-12 NOTE — ED Notes (Signed)
BEHAVIORAL HEALTH ROUNDING Patient sleeping: Yes.   Patient alert and oriented:no Behavior appropriate: Yes.  ; If no, describe:  Nutrition and fluids offered: Yes  Toileting and hygiene offered: Yes  Sitter present: yes Law enforcement present: Yes  

## 2014-11-12 NOTE — Consult Note (Signed)
  Psychiatry: Follow-up for this 69 year old man with a history of dementia recently appearing to be much more bipolar. Patient appears quite different today. He has had a lot of trouble standing up. Falling several times. He was on his back but still awake and agitated but not able to converse very well. Patient remains very confused.  I am stopping the morning dose of Seroquel and also will order a carbamazepine level for Sunday to make sure we are not over dosing that medicine. We have had several turn downs from geriatric psychiatry units and are now going to look at [facility] continuing to try and treat him appropriately. Case discussed with TTS staff and social work.  Diagnoses bipolar disorder manic

## 2014-11-12 NOTE — ED Notes (Signed)
Pt in room. No complaints or concerns voiced at this time. No abnormal behavior noted at this time. Will continue to monitor with q15 min checks. ODS officer in area. 

## 2014-11-12 NOTE — ED Notes (Signed)
BEHAVIORAL HEALTH ROUNDING Patient sleeping: Yes.   Patient alert and oriented: not applicable Behavior appropriate: Yes.  ; If no, describe:  Nutrition and fluids offered: Yes  Toileting and hygiene offered: Yes  Sitter present: yes Law enforcement present: Yes  

## 2014-11-12 NOTE — ED Provider Notes (Signed)
-----------------------------------------   6:47 AM on 11/12/2014 -----------------------------------------   Blood pressure 131/81, pulse 88, temperature 97.9 F (36.6 C), temperature source Oral, resp. rate 18, height 6\' 1"  (1.854 m), weight 220 lb (99.791 kg), SpO2 98 %.  The patient had no acute events since last update.  Calm and cooperative at this time.  Disposition is pending per Psychiatry/Behavioral Medicine team recommendations.     Irean HongJade J Lennix Kneisel, MD 11/12/14 [phone]

## 2014-11-12 NOTE — ED Notes (Signed)
BEHAVIORAL HEALTH ROUNDING Patient sleeping: No. Patient alert and oriented: no Behavior appropriate: Yes.  ; If no, describe:  Nutrition and fluids offered: Yes  Toileting and hygiene offered: Yes  Sitter present: yes Law enforcement present: Yes  

## 2014-11-12 NOTE — ED Notes (Signed)
[facility]  Pt in room. No complaints or concerns voiced at this time. Pt kicking legs into air, talking loudly, verbalizations not consistent with current situation. Will continue to monitor with q15 min checks. ODS officer in area.

## 2014-11-12 NOTE — ED Notes (Signed)
Pt HS medications held at this time, when pt awakens HS medications will be administer.

## 2014-11-12 NOTE — ED Notes (Signed)
Patient assigned to appropriate care area. Patient oriented to unit/care area: Informed that, for their safety, care areas are designed for safety and monitored by security cameras at all times; and visiting hours explained to patient. Patient verbalizes understanding, and verbal contract for safety obtained. 

## 2014-11-12 NOTE — ED Notes (Signed)

## 2014-11-12 NOTE — ED Notes (Signed)
Pt in room. No complaints or concerns voiced at this time. Pt sleeping at this time. No abnormal behavior noted at this time. Will continue to monitor with q15 min checks. ODS officer in area.

## 2014-11-12 NOTE — ED Notes (Signed)
BEHAVIORAL HEALTH ROUNDING Patient sleeping: Yes.   Patient alert and oriented: not applicable Behavior appropriate: Yes.  ; If no, describe:  Nutrition and fluids offered: Yes  Toileting and hygiene offered: Yes  Sitter present: no Law enforcement present: Yes  

## 2014-11-12 NOTE — ED Notes (Signed)
BEHAVIORAL HEALTH ROUNDING Patient sleeping: No. Patient alert and oriented: alert oriented only to self Behavior appropriate: Yes.   Nutrition and fluids offered: Yes  Toileting and hygiene offered: Yes  Sitter present: q15 min observations Law enforcement present: Yes [employer]

## 2014-11-12 NOTE — ED Notes (Signed)
Ivc /pending placement 

## 2014-11-12 NOTE — ED Notes (Signed)
BEHAVIORAL HEALTH ROUNDING Patient sleeping: Yes.   Patient alert and oriented: not applicable Behavior appropriate: Yes.    Nutrition and fluids offered: No Toileting and hygiene offered: No Sitter present: q15 minute observations Law enforcement present: Yes [employer] 

## 2014-11-12 NOTE — ED Notes (Signed)
Pt woke up, parnoid thoughts a nd speec, trying to leave room, meds given as ordered

## 2014-11-12 NOTE — ED Notes (Signed)
BEHAVIORAL HEALTH ROUNDING Patient sleeping: Yes.   Patient alert and oriented: no Behavior appropriate: No.; If no, describe:  Nutrition and fluids offered: Yes  Toileting and hygiene offered: Yes  Sitter present: no Law enforcement present: Yes

## 2014-11-12 NOTE — ED Notes (Signed)
Report received from [facility] Kathy C.  Pt in room, sleeping on mats on floor. Safety Sitter at bedside. No complaints or concerns voiced at this time. No abnormal behavior noted at this time. Will continue to monitor with q15 min checks. ODS officer in area.

## 2014-11-12 NOTE — ED Notes (Signed)
Due to fall last pm, pt laying on floor on top of mat

## 2014-11-12 NOTE — ED Notes (Signed)
Pt in room. No complaints or concerns voiced at this time. Pt now on mats on floor after fall from stretcher. Pt sitting in center of mats, placing hands into groin area, then smelling fingers. Pt sitting with pants off on mat.  Will continue to monitor with q15 min checks. ODS officer in area.  Safety sitter at bedside.

## 2014-11-12 NOTE — ED Notes (Signed)
BEHAVIORAL HEALTH ROUNDING Patient sleeping: No. Patient alert and oriented: pt alert, oriented only to person Behavior appropriate: Yes.   Nutrition and fluids offered: Yes  Toileting and hygiene offered: Yes  Sitter present: q15 min observations Law enforcement present: Yes [employer]  ENVIRONMENTAL ASSESSMENT Potentially harmful objects out of patient reach: Yes.   Personal belongings secured: Yes.   Patient dressed in hospital provided attire only: Yes.   Plastic bags out of patient reach: Yes.   Patient care equipment (cords, cables, call bells, lines, and drains) shortened, removed, or accounted for: Yes.   Equipment and supplies removed from bottom of stretcher: Yes.   Potentially toxic materials out of patient reach: Yes.   Sharps container removed or out of patient reach: Yes.    ED BHU PLACEMENT JUSTIFICATION Is the patient under IVC or is there intent for IVC: Yes.    Is IVC current? Yes Is the patient medically cleared: Yes.   Is there vacancy in the ED BHU: Yes.   Is the population mix appropriate for patient: No pt high fall risk with fall prior to this time. Pt unable to walk to bathroom, requires assistance with ADLs   Is the patient awaiting placement in inpatient or outpatient setting: Yes.   Has the patient had a psychiatric consult: Yes.   Survey of unit performed for contraband, proper placement and condition of furniture, tampering with fixtures in bathroom, shower, and each patient room: Yes.  ; Findings: none APPEARANCE/BEHAVIOR Cooperative,calm NEURO ASSESSMENT Orientation: person Hallucinations: none noted at this time Speech: Normal Gait: shuffling RESPIRATORY ASSESSMENT Breathing Pattern-regular, no respiratory distress noted CARDIOVASCULAR ASSESSMENT Skin color appropriate for age and race GASTROINTESTINAL ASSESSMENT no GI distress noted EXTREMITIES Moves all extremities, no distress noted PLAN OF CARE Provide calm/safe environment. Vital  signs assessed twice daily. ED BHU Assessment once each 12-hour shift. Collaborate with intake RN daily or as condition indicates. Assure the ED provider has rounded once each shift. Provide and encourage hygiene. Provide redirection as needed. Assess for escalating behavior; address immediately and inform ED provider.  Assess family dynamic and appropriateness for visitation as needed: Yes.  Educate the patient/family about BHU procedures/visitation: Yes.

## 2014-11-12 NOTE — ED Notes (Signed)
1935 note charted in error under Rea CollegeKathleen Cox RN.  1930 and [id number] [place] acknowledged in error under Rea CollegeKathleen Cox RN.

## 2014-11-12 NOTE — ED Notes (Signed)
BEHAVIORAL HEALTH ROUNDING Patient sleeping: Yes.   Patient alert and oriented: not applicable Behavior appropriate: Yes.    Nutrition and fluids offered: No Toileting and hygiene offered: No Sitter present: q15 minute observations Law enforcement present: Yes [employer]  ENVIRONMENTAL ASSESSMENT Potentially harmful objects out of patient reach: Yes.   Personal belongings secured: Yes.   Patient dressed in hospital provided attire only: Yes.   Plastic bags out of patient reach: Yes.   Patient care equipment (cords, cables, call bells, lines, and drains) shortened, removed, or accounted for: Yes.   Equipment and supplies removed from bottom of stretcher: Yes.   Potentially toxic materials out of patient reach: Yes.   Sharps container removed or out of patient reach: Yes.    ED BHU PLACEMENT JUSTIFICATION Is the patient under IVC or is there intent for IVC: Yes.    Is IVC current? Yes Is the patient medically cleared: Yes.   Is there vacancy in the ED BHU: Yes.   Is the population mix appropriate for patient: No, pt has Alzheimer's and requires assistance with ADLs   Is the patient awaiting placement in inpatient or outpatient setting: Yes.   Has the patient had a psychiatric consult: Yes.   Survey of unit performed for contraband, proper placement and condition of furniture, tampering with fixtures in bathroom, shower, and each patient room: Yes.  ; Findings: none APPEARANCE/BEHAVIOR sleeping NEURO ASSESSMENT Orientation: person Hallucinations: none noted at this time, pt sleeping at this time Speech: Normal Gait: shuffling RESPIRATORY ASSESSMENT Breathing Pattern-regular, no respiratory distress noted CARDIOVASCULAR ASSESSMENT Skin color appropriate for age and race GASTROINTESTINAL ASSESSMENT no GI distress noted EXTREMITIES Moves all extremities, no distress noted PLAN OF CARE Provide calm/safe environment. Vital signs assessed twice daily. ED BHU Assessment once each  12-hour shift. Collaborate with intake RN daily or as condition indicates. Assure the ED provider has rounded once each shift. Provide and encourage hygiene. Provide redirection as needed. Assess for escalating behavior; address immediately and inform ED provider.  Assess family dynamic and appropriateness for visitation as needed: Yes.  Educate the patient/family about BHU procedures/visitation: Yes.

## 2014-11-12 NOTE — ED Notes (Addendum)
Medical POA Justin Howard called to check on pt's status. Per POA pt is also bi-polar. POA reports that pt does better with ativan than haldol. Per POA haldol "seems to increase is aggression". Per POA "ativan seems to work better for Justin Howard". POA also requested that [facility] not be [facility], due to when [facility]  approximately this time last year per [facility] and [facility] staff "pt's behavior was worse then when he went [facility]  [facility]".  [facility] over [facility] last 2 years per POA.

## 2014-11-12 NOTE — BHH Counselor (Signed)
Referral Status for Placement  Pending Review 11/12/2014-[facility]([phone]) 11/12/2014-[facility]([phone])  Declined- 11/12/2014-[facility]-(Jessica [phone])-No Beds 11/12/2014-[facility] (Chirs-[phone]), Declined due to aggression 11/11/2014-[facility](Jonathan-[phone]), Declined due to Alzheimer 11/11/2014-[facility] ([phone]), No Beds 11/10/2014-[facility] ([phone])-Declined on  due to Dementia  Unable to reach anyone [phone])

## 2014-11-13 MED ORDER — LORAZEPAM 2 MG/ML IJ SOLN
2.0000 mg | Freq: Once | INTRAMUSCULAR | Status: AC
  Administered 2014-11-13: 2 mg via INTRAVENOUS

## 2014-11-13 MED ORDER — METFORMIN HCL 500 MG PO TABS
ORAL_TABLET | ORAL | Status: AC
  Administered 2014-11-13: 500 mg via ORAL
  Filled 2014-11-13: qty 1

## 2014-11-13 MED ORDER — LORAZEPAM 2 MG/ML IJ SOLN
INTRAMUSCULAR | Status: AC
Start: 2014-11-13 — End: 2014-11-13
  Administered 2014-11-13: 2 mg via INTRAVENOUS
  Filled 2014-11-13: qty 1

## 2014-11-13 NOTE — ED Notes (Signed)
BEHAVIORAL HEALTH ROUNDING Patient sleeping: Yes.   Patient alert and oriented: not applicable Behavior appropriate: Yes.    Nutrition and fluids offered: No Toileting and hygiene offered: No Sitter present: q15 minute observations Law enforcement present: Yes [employer] 

## 2014-11-13 NOTE — ED Notes (Signed)
Ivc /pending placement 

## 2014-11-13 NOTE — ED Notes (Signed)
Pt in room. No complaints or concerns voiced at this time. No abnormal behavior noted at this time. Will continue to monitor with q15 min checks. ODS officer in area. 

## 2014-11-13 NOTE — ED Notes (Signed)
Pt ate 100% of breakfast.

## 2014-11-13 NOTE — ED Notes (Addendum)
BEHAVIORAL HEALTH ROUNDING Patient sleeping: Yes.   Patient alert and oriented: yes Behavior appropriate: Yes.   Nutrition and fluids offered: Yes  Toileting and hygiene offered: Yes  Sitter present: yes Law enforcement present: Yes   ENVIRONMENTAL ASSESSMENT Potentially harmful objects out of patient reach: Yes.   Personal belongings secured: Yes.   Patient dressed in hospital provided attire only: Yes.   Plastic bags out of patient reach: Yes.   Patient care equipment (cords, cables, call bells, lines, and drains) shortened, removed, or accounted for: Yes.   Equipment and supplies removed from bottom of stretcher: Yes.   Potentially toxic materials out of patient reach: Yes.   Sharps container removed or out of patient reach: Yes.     1:1 sitter at bedside

## 2014-11-13 NOTE — ED Notes (Signed)
Justin SanderAnne Thornly POA passcode- ANGEL GIRL

## 2014-11-13 NOTE — BH Assessment (Signed)
Referral Status for Placement Pending [facility] Tr[facility] Number [id number]) Received from [employer] (Melissia Bennett-[phone])  Verbal Screening completed with [facility] ([phone]) [facility] and confirmed it was received ([phone]).  Under Review for Geriatric Wait List.  Denials  11/13/2014-[facility]([phone]) 11/13/2014-[facility](Peggy-[phone]) 11/12/2014-[facility]-(Jessica [phone])-[phone])-No Beds 11/12/2014-[facility] ([phone]), Declined due to aggression 11/11/2014-[facility](Jonathan-[phone]), Declined due to Alzheimer 11/11/2014-[facility] ([phone]), No Beds 11/10/2014-[facility] ([phone])-Declined on due to Dementia

## 2014-11-13 NOTE — ED Notes (Signed)
BEHAVIORAL HEALTH ROUNDING Patient sleeping: Yes.   Patient alert and oriented: yes Behavior appropriate: Yes.  ;  Nutrition and fluids offered: Yes  Toileting and hygiene offered: Yes  Sitter present: yes Law enforcement present: Yes  

## 2014-11-13 NOTE — ED Provider Notes (Signed)
-----------------------------------------   6:57 AM on 11/13/2014 -----------------------------------------   Blood pressure 148/80, pulse 74, temperature 98.2 F (36.8 C), temperature source Oral, resp. rate 18, height 6\' 1"  (1.854 m), weight 220 lb (99.791 kg), SpO2 99 %.  Patient was given Ativan overnight for agitation with good results.  Calm and cooperative at this time.  Disposition is pending per Psychiatry/Behavioral Medicine team recommendations.     Irean HongJade J Davonn Flanery, MD 11/13/14 [phone]

## 2014-11-14 LAB — CBC WITH DIFFERENTIAL/PLATELET
BASOS ABS: 0 10*3/uL (ref 0–0.1)
BASOS PCT: 1 %
Eosinophils Absolute: 0.1 10*3/uL (ref 0–0.7)
Eosinophils Relative: 3 %
HEMATOCRIT: 25.8 % — AB (ref 40.0–52.0)
Hemoglobin: 8.5 g/dL — ABNORMAL LOW (ref 13.0–18.0)
LYMPHS PCT: 32 %
Lymphs Abs: 1.3 10*3/uL (ref 1.0–3.6)
MCH: 30 pg (ref 26.0–34.0)
MCHC: 32.9 g/dL (ref 32.0–36.0)
MCV: 91.3 fL (ref 80.0–100.0)
MONO ABS: 0.3 10*3/uL (ref 0.2–1.0)
Monocytes Relative: 8 %
NEUTROS ABS: 2.3 10*3/uL (ref 1.4–6.5)
NEUTROS PCT: 56 %
Platelets: 160 10*3/uL (ref 150–440)
RBC: 2.83 MIL/uL — AB (ref 4.40–5.90)
RDW: 17.1 % — AB (ref 11.5–14.5)
WBC: 4.1 10*3/uL (ref 3.8–10.6)

## 2014-11-14 MED ORDER — LORAZEPAM 0.5 MG PO TABS
0.5000 mg | ORAL_TABLET | Freq: Once | ORAL | Status: AC
  Administered 2014-11-14: 0.5 mg via ORAL
  Filled 2014-11-14: qty 1

## 2014-11-14 MED ORDER — LORAZEPAM 2 MG/ML IJ SOLN
2.0000 mg | Freq: Once | INTRAMUSCULAR | Status: AC
  Administered 2014-11-14: 2 mg via INTRAMUSCULAR
  Filled 2014-11-14: qty 1

## 2014-11-14 MED ORDER — QUETIAPINE FUMARATE 25 MG PO TABS
100.0000 mg | ORAL_TABLET | ORAL | Status: AC
  Administered 2014-11-14: 100 mg via ORAL
  Filled 2014-11-14: qty 4

## 2014-11-14 MED ORDER — LORAZEPAM 2 MG/ML IJ SOLN
1.0000 mg | Freq: Once | INTRAMUSCULAR | Status: AC
  Administered 2014-11-14: 1 mg via INTRAMUSCULAR
  Filled 2014-11-14: qty 1

## 2014-11-14 NOTE — ED Provider Notes (Signed)
-----------------------------------------   5:29 PM on 11/14/2014 -----------------------------------------  Patient is no acute distress, continued to try to get up, staff concerned that the patient will harm himself he keeps turned to thrash about the room. I would see him he was sitting down but now again he will stand up and agitatedly and uncoordinatedly move around the room. This is obvious cannot say for him. He did have a small fall onto a cushioned floor from a small height with no evidence of intracranial injury remains neurologically intact. It didn't think repeat CT scan is in his best interest right now. We will continue to give the medication to try to reduce his anxiety and hopefully prevent him from injuring himself or others. Unclear how long this patient will be in the department.  Jeanmarie PlantJames A McShane, MD 11/14/14 [phone]

## 2014-11-14 NOTE — ED Notes (Signed)
Report received from RN Kerry  Pt in room. No complaints or concerns voiced at this time. No abnormal behavior noted at this time. Will continue to monitor with q15 min checks. ODS officer in area. 

## 2014-11-14 NOTE — ED Notes (Signed)
Pt urinated in floor. Linens and gown changed. Still agitated.

## 2014-11-14 NOTE — ED Notes (Signed)
BEHAVIORAL HEALTH ROUNDING Patient sleeping: Yes.   Patient alert and oriented: not applicable Behavior appropriate: Yes.  ;  Nutrition and fluids offered: No Toileting and hygiene offered: No Sitter present: Writersafety sitter Law enforcement present: Yes

## 2014-11-14 NOTE — ED Notes (Signed)
BEHAVIORAL HEALTH ROUNDING Patient sleeping: Yes.   Patient alert and oriented: not applicable Behavior appropriate: Yes.  ; If no, describe:  Nutrition and fluids offered: No Toileting and hygiene offered: No Sitter present: safety sitter present Law enforcement present: Yes  

## 2014-11-14 NOTE — ED Notes (Signed)

## 2014-11-14 NOTE — ED Notes (Signed)
BEHAVIORAL HEALTH ROUNDING Patient sleeping: Yes.   Patient alert and oriented: not applicable Behavior appropriate: Yes.    Nutrition and fluids offered: No Toileting and hygiene offered: No Sitter present: q15 minute observations Law enforcement present: Yes [employer] 

## 2014-11-14 NOTE — ED Notes (Signed)
Continuously trying to get up throughout day. Scientist, research (life sciences)itter and officer have been assisting with placing pt back into sitting position. Ativan given via PO without much success. Order obtained for increased dose of IM ativan. Pending results. MD to reevaluate. Pt sustained fall while getting up and fell back onto head. MD McShane notified, denies need of CT head or any orders at this time.

## 2014-11-14 NOTE — ED Notes (Signed)
BEHAVIORAL HEALTH ROUNDING Patient sleeping: No. Patient alert and oriented: no Behavior appropriate: Yes.  ;  Nutrition and fluids offered: Yes  Toileting and hygiene offered: Yes  Sitter present: safety sitter present Law enforcement present: Yes

## 2014-11-14 NOTE — ED Notes (Signed)
BEHAVIORAL HEALTH ROUNDING Patient sleeping: No. Patient alert and oriented: no Behavior appropriate: No.; If no, describe: pt is talking about leaves and water, not making sense Nutrition and fluids offered: Yes  Toileting and hygiene offered: Yes  Sitter present: Writersafety sitter Law enforcement present: Yes

## 2014-11-14 NOTE — ED Notes (Signed)
Pt in room. No complaints or concerns voiced at this time. No abnormal behavior noted at this time. Will continue to monitor with q15 min checks. ODS officer in area. 

## 2014-11-14 NOTE — ED Provider Notes (Addendum)
-----------------------------------------   7:10 AM on 11/14/2014 -----------------------------------------   Blood pressure 115/87, pulse 107, temperature 97.6 F (36.4 C), temperature source Oral, resp. rate 16, height 6\' 1"  (1.854 m), weight 220 lb (99.791 kg), SpO2 98 %.  The patient had no acute events since last update.  Calm and cooperative at this time.  Disposition is pending per Psychiatry/Behavioral Medicine team recommendations.      Sharyn CreamerMark Allice Garro, MD 11/14/14 0710  ----------------------------------------- 10:29 AM on 11/14/2014 -----------------------------------------  Patient is slightly agitated this morning, ambulating attempting to walk out of the ER. We'll give him some additional Seroquel as well as 0.5 mg Ativan for calming his mild agitation. Patient is not violent.  Sharyn CreamerMark Aleatha Taite, MD 11/14/14 [phone]

## 2014-11-14 NOTE — ED Notes (Signed)
BEHAVIORAL HEALTH ROUNDING Patient sleeping: Yes.   Patient alert and oriented: not applicable Behavior appropriate: Yes.    Nutrition and fluids offered: No Toileting and hygiene offered: No Sitter present: q15 minute observations Law enforcement present: Yes [employer]  ENVIRONMENTAL ASSESSMENT Potentially harmful objects out of patient reach: Yes.   Personal belongings secured: Yes.   Patient dressed in hospital provided attire only: Yes.   Plastic bags out of patient reach: Yes.   Patient care equipment (cords, cables, call bells, lines, and drains) shortened, removed, or accounted for: Yes.   Equipment and supplies removed from bottom of stretcher: Yes.   Potentially toxic materials out of patient reach: Yes.   Sharps container removed or out of patient reach: Yes.   

## 2014-11-14 NOTE — ED Notes (Signed)
Pt currently sitting in the floor on pads, less agitated.

## 2014-11-14 NOTE — BH Assessment (Signed)
Writer followed with [facility] ([facility]-[phone]) and referral is pending review by their medical staff, for the Wait List.

## 2014-11-15 MED ORDER — LORAZEPAM 2 MG/ML IJ SOLN
INTRAMUSCULAR | Status: AC
  Administered 2014-11-15: 2 mg via INTRAMUSCULAR
  Filled 2014-11-15: qty 1

## 2014-11-15 MED ORDER — LORAZEPAM 2 MG/ML IJ SOLN
2.0000 mg | Freq: Once | INTRAMUSCULAR | Status: AC
  Administered 2014-11-15: 2 mg via INTRAMUSCULAR

## 2014-11-15 MED ORDER — HALOPERIDOL LACTATE 5 MG/ML IJ SOLN
5.0000 mg | Freq: Once | INTRAMUSCULAR | Status: AC
  Administered 2014-11-15: 5 mg via INTRAMUSCULAR
  Filled 2014-11-15: qty 1

## 2014-11-15 NOTE — ED Notes (Signed)
BEHAVIORAL HEALTH ROUNDING Patient sleeping: No. Patient alert and oriented: yes Behavior appropriate: Yes.   Nutrition and fluids offered: Yes  Toileting and hygiene offered: Yes  Sitter present: q15 min observations Law enforcement present: Yes [employer]  ENVIRONMENTAL ASSESSMENT Potentially harmful objects out of patient reach: Yes.   Personal belongings secured: Yes.   Patient dressed in hospital provided attire only: Yes.   Plastic bags out of patient reach: Yes.   Patient care equipment (cords, cables, call bells, lines, and drains) shortened, removed, or accounted for: Yes.   Equipment and supplies removed from bottom of stretcher: Yes.   Potentially toxic materials out of patient reach: Yes.   Sharps container removed or out of patient reach: Yes.    ED BHU PLACEMENT JUSTIFICATION Is the patient under IVC or is there intent for IVC: Yes.    Is IVC current? Yes Is the patient medically cleared: Yes.   Is there vacancy in the ED BHU: Yes.   Is the population mix appropriate for patient: No pt with hx of 2 falls this admission, requires assistance with ADLs   Is the patient awaiting placement in inpatient or outpatient setting: Yes.   Has the patient had a psychiatric consult: Yes.   Survey of unit performed for contraband, proper placement and condition of furniture, tampering with fixtures in bathroom, shower, and each patient room: Yes.  ; Findings: none APPEARANCE/BEHAVIOR Cooperative,calm NEURO ASSESSMENT Orientation: person Hallucinations: none noted at this time Speech: Normal Gait: shuffling RESPIRATORY ASSESSMENT Breathing Pattern-regular, no respiratory distress noted CARDIOVASCULAR ASSESSMENT Skin color appropriate for age and race GASTROINTESTINAL ASSESSMENT no GI distress noted EXTREMITIES Moves all extremities, no distress noted PLAN OF CARE Provide calm/safe environment. Vital signs assessed twice daily. ED BHU Assessment once each 12-hour shift.  Collaborate with intake RN daily or as condition indicates. Assure the ED provider has rounded once each shift. Provide and encourage hygiene. Provide redirection as needed. Assess for escalating behavior; address immediately and inform ED provider.  Assess family dynamic and appropriateness for visitation as needed: Yes.  Educate the patient/family about BHU procedures/visitation: Yes.

## 2014-11-15 NOTE — ED Notes (Addendum)
BEHAVIORAL HEALTH ROUNDING Patient sleeping: Yes.   Patient alert and oriented: not applicable SLEEPING Behavior appropriate: Yes.  ; If no, describe: SLEEPING Nutrition and fluids offered: No SLEEPING Toileting and hygiene offered: NoSLEEPING Sitter present: not applicable Law enforcement present: Yes [employer]  ED BHU PLACEMENT JUSTIFICATION  Is the patient under IVC or is there intent for IVC: Yes.  Is the patient medically cleared: Yes.  Is there vacancy in the ED BHU: Yes.  Is the population mix appropriate for patient: no Is the patient awaiting placement in inpatient or outpatient setting: Yes.  Has the patient had a psychiatric consult: Yes.  Survey of unit performed for contraband, proper placement and condition of furniture, tampering with fixtures in bathroom, shower, and each patient room: Yes. ; Findings: All clear  APPEARANCE/BEHAVIOR  sleeping NEURO ASSESSMENT  Orientation: orientated to self.  Hallucinations: No.None noted (Hallucinations)  Speech: Normal  Gait: normal  RESPIRATORY ASSESSMENT  WNL  CARDIOVASCULAR ASSESSMENT  WNL  GASTROINTESTINAL ASSESSMENT  WNL  EXTREMITIES  WNL  PLAN OF CARE  Provide calm/safe environment. Vital signs assessed twice daily. ED BHU Assessment once each 12-hour shift. Collaborate with intake RN daily or as condition indicates. Assure the ED provider has rounded once each shift. Provide and encourage hygiene. Provide redirection as needed. Assess for escalating behavior; address immediately and inform ED provider.  Assess family dynamic and appropriateness for visitation as needed: Yes. ; If necessary, describe findings:  Educate the patient/family about BHU procedures/visitation: Yes. ; If necessary, describe findings: Pt is sleeping at this time.  Will continue to monitor.

## 2014-11-15 NOTE — ED Notes (Signed)
Report received from RN Amy T  Pt in room. No complaints or concerns voiced at this time. No abnormal behavior noted at this time. Will continue to monitor with q15 min checks. ODS officer in area. 

## 2014-11-15 NOTE — ED Provider Notes (Signed)
-----------------------------------------   9:25 AM on 11/15/2014 -----------------------------------------   Blood pressure 124/64, pulse 75, temperature 97.9 F (36.6 C), temperature source Oral, resp. rate 18, height 6\' 1"  (1.854 m), weight 220 lb (99.791 kg), SpO2 99 %.  The patient had no acute events since last update.  Calm and cooperative at this time.  Disposition is pending per Psychiatry/Behavioral Medicine team recommendations.     Sharman CheekPhillip Ezriel Boffa, MD 11/15/14 [phone]

## 2014-11-15 NOTE — ED Notes (Signed)
Supper provided  

## 2014-11-15 NOTE — ED Notes (Signed)
BEHAVIORAL HEALTH ROUNDING Patient sleeping: No. Patient alert and oriented: no ox1 Behavior appropriate:   ; If no, describe:  Nutrition and fluids offered: yes Toileting and hygiene offered: Yes  Sitter present: q15 minute observations and security camera monitoring Law enforcement present: Yes  ODS

## 2014-11-15 NOTE — ED Notes (Signed)
Pt in room. No complaints or concerns voiced at this time. No abnormal behavior noted at this time. Will continue to monitor with q15 min checks. ODS officer in area. 

## 2014-11-15 NOTE — ED Notes (Signed)
BEHAVIORAL HEALTH ROUNDING Patient sleeping: No. Patient alert and oriented: no ox1 Behavior appropriate:   ; If no, describe:  He continues to be up and down - rolling around on the floor of his room  Nutrition and fluids offered: yes Toileting and hygiene offered: Yes  Sitter present: q15 minute observations and security camera monitoring Law enforcement present: Yes  ODS

## 2014-11-15 NOTE — ED Notes (Addendum)
BEHAVIORAL HEALTH ROUNDING Patient sleeping: No. Patient alert and oriented: no Behavior appropriate: Yes.  ; If no, describe:  Nutrition and fluids offered: yes Toileting and hygiene offered: Yes  Sitter present: q15 minute observations and security camera monitoring Law enforcement present: Yes  ODS  

## 2014-11-15 NOTE — ED Notes (Signed)
BEHAVIORAL HEALTH ROUNDING Patient sleeping: Yes.   Patient alert and oriented: not applicable Behavior appropriate: Yes.    Nutrition and fluids offered: No Toileting and hygiene offered: No Sitter present: q15 minute observations Law enforcement present: Yes [employer] 

## 2014-11-15 NOTE — ED Notes (Addendum)
No results from IM med at this time  He continues to roll all over the floor  I have received another call from the lady that states she is his POA  - I was told that she called CSW multiple times today and has never heard back from anyone    WE REALLY NEED A CSW IN THE ED EVERYDAY - THIS PT'S CARE IS BEING DELAYED EVERYDAY THAT NOONE FOLLOWS UP ON HIS PLACEMENT OPTIONS  SAFETY PORTAL TO BE COMPLETED  FAMILY INTENDS TO REPORT THIS ALSO

## 2014-11-15 NOTE — ED Notes (Signed)
Breakfast provided   He is sitting on the mattress in the floor  Pt visualized with NAD  No verbalized needs or concerns at this time  Continue to monitor

## 2014-11-15 NOTE — ED Notes (Signed)
Haldol administered as ordered

## 2014-11-15 NOTE — ED Notes (Signed)
BEHAVIORAL HEALTH ROUNDING Patient sleeping: No. Patient alert and oriented:  No ox1 Behavior appropriate: Yes.  ; If no, describe:  Nutrition and fluids offered: yes Toileting and hygiene offered: Yes  Sitter present: q15 minute observations and security camera monitoring Law enforcement present: Yes  ODS

## 2014-11-15 NOTE — ED Notes (Signed)
BEHAVIORAL HEALTH ROUNDING Patient sleeping: Yes.   Patient alert and oriented: not applicable SLEEPING Behavior appropriate: Yes.  ; If no, describe: SLEEPING Nutrition and fluids offered: No SLEEPING Toileting and hygiene offered: NoSLEEPING Sitter present: not applicable Law enforcement present: Yes ODS 

## 2014-11-15 NOTE — ED Notes (Signed)
He is lying on a mattress on the floor at this time  - he is awake  Staff reports that he has been awake all night  NAD observed

## 2014-11-15 NOTE — ED Notes (Addendum)
Dr Zenda AlpersWebster in to bedside and pt checked for occult blood in his stool. Per Dr Zenda AlpersWebster results were negative for occult blood. Pt tolerated procedure well.

## 2014-11-15 NOTE — ED Notes (Signed)
He has rolled all over the floor today - mattress still remains in the floor and you can orient him to get on the mattress to rest and he then rolls right back into the floor - at times he rocks - he got up on all four and then stood - pushing the tech  - ativan was administered as ordered

## 2014-11-15 NOTE — ED Notes (Signed)
Received a call from someone stating that they are his POA - update provided  POA requesting update about placement  CSW phone number provided

## 2014-11-15 NOTE — Consult Note (Signed)
  Psychiatry: Continued follow-up for this 69 year old man with a history of dementia and a presentation more consistent with bipolar disorder. Patient on interview today continues to be almost delirious. He mostly rolls around on the floor unable to get up. Agitated. Not able to answer questions very coherently. Looks confused.  Vital signs are stable. Unclear why he is gotten so much more agitated. Carbamazepine level will be checked tomorrow morning. [facility] because he has been referred to multiple geriatric psychiatry units that of turned him down. Continue monitoring and treating here in the emergency room. May need to back off on some of his medicines because of how he has had so much difficulty walking. 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

## 2014-11-15 NOTE — ED Notes (Addendum)
BEHAVIORAL HEALTH ROUNDING Patient sleeping: No. Patient alert and oriented: no  ox1 Behavior appropriate: Yes.  ; If no, describe:  Nutrition and fluids offered: yes Toileting and hygiene offered: Yes  Sitter present: q15 minute observations and security camera monitoring Law enforcement present: Yes  ODS  Am meds administered as ordered  Assessment completed     He denies pain  Continue to monitor closely  1:1 sitter remains at his side

## 2014-11-15 NOTE — ED Notes (Signed)
BEHAVIORAL HEALTH ROUNDING  Patient sleeping: No.  Patient alert and oriented: yes  Behavior appropriate: Yes. ; If no, describe:  Nutrition and fluids offered: Yes  Toileting and hygiene offered: Yes  Sitter present: not applicable  Law enforcement present: Yes ODS  

## 2014-11-15 NOTE — ED Notes (Addendum)
ED BHU PLACEMENT JUSTIFICATION Is the patient under IVC or is there intent for IVC: Yes.   Is the patient medically cleared: Yes.   Is there vacancy in the ED BHU: Yes.   Is the population mix appropriate for patient: Yes.   Is the patient awaiting placement in inpatient or outpatient setting: Yes.  Has the patient had a psychiatric consult: Yes.   Survey of unit performed for contraband, proper placement and condition of furniture, tampering with fixtures in bathroom, shower, and each patient room: Yes.  ; Findings:  APPEARANCE/BEHAVIOR cooperative NEURO ASSESSMENT Orientation: oriented x1 Denies pain Hallucinations: No.None noted (Hallucinations) Speech: Normal Gait: unsteady RESPIRATORY ASSESSMENT Even  Unlabored respirations  CARDIOVASCULAR ASSESSMENT Pulses equal   regular rate  Skin warm and dry   GASTROINTESTINAL ASSESSMENT no GI complaint EXTREMITIES Full ROM  PLAN OF CARE Provide calm/safe environment. Vital signs assessed twice daily. ED BHU Assessment once each 12-hour shift. Collaborate with intake RN daily or as condition indicates. Assure the ED provider has rounded once each shift. Provide and encourage hygiene. Provide redirection as needed. Assess for escalating behavior; address immediately and inform ED provider.  Assess family dynamic and appropriateness for visitation as needed: Yes.  ; If necessary, describe findings:  Educate the patient/family about BHU procedures/visitation: Yes.  ; If necessary, describe findings:

## 2014-11-16 LAB — CARBAMAZEPINE, FREE AND TOTAL
CARBAMAZEPINE, TOTAL: 6.3 ug/mL (ref 4.0–12.0)
Carbamazepine, Free: 1.6 ug/mL (ref 0.6–4.2)

## 2014-11-16 MED ORDER — HALOPERIDOL LACTATE 5 MG/ML IJ SOLN
5.0000 mg | Freq: Once | INTRAMUSCULAR | Status: AC
  Administered 2014-11-16: 5 mg via INTRAMUSCULAR

## 2014-11-16 MED ORDER — ZIPRASIDONE MESYLATE 20 MG IM SOLR
INTRAMUSCULAR | Status: AC
  Filled 2014-11-16: qty 20

## 2014-11-16 MED ORDER — ZIPRASIDONE MESYLATE 20 MG IM SOLR
10.0000 mg | Freq: Once | INTRAMUSCULAR | Status: AC
  Administered 2014-11-16: 10 mg via INTRAMUSCULAR

## 2014-11-16 MED ORDER — HALOPERIDOL LACTATE 5 MG/ML IJ SOLN
INTRAMUSCULAR | Status: AC
Start: 2014-11-16 — End: 2014-11-16
  Filled 2014-11-16: qty 1

## 2014-11-16 MED ORDER — HALOPERIDOL LACTATE 5 MG/ML IJ SOLN
INTRAMUSCULAR | Status: AC
Start: 2014-11-16 — End: 2014-11-16
  Administered 2014-11-16: 5 mg via INTRAMUSCULAR
  Filled 2014-11-16: qty 1

## 2014-11-16 NOTE — ED Notes (Signed)
Pt in room. No complaints or concerns voiced at this time. No abnormal behavior noted at this time. Will continue to monitor with q15 min checks. ODS officer in area. 

## 2014-11-16 NOTE — ED Notes (Signed)
BEHAVIORAL HEALTH ROUNDING Patient sleeping: Yes.   Patient alert and oriented: not applicable Behavior appropriate: Yes.    Nutrition and fluids offered: No Toileting and hygiene offered: No Sitter present: q15 minute observations Law enforcement present: Yes [employer] 

## 2014-11-16 NOTE — ED Notes (Addendum)
BEHAVIORAL HEALTH ROUNDING Patient sleeping: YES Patient alert and oriented: YES Behavior appropriate: YES Describe behavior: No inappropriate or unacceptable behaviors noted at this time.  Nutrition and fluids offered: YES Toileting and hygiene offered: YES Sitter present: Behavioral tech rounding every 15 minutes on patient to ensure safety. Sitter at bedside.  Law enforcement present: YES Law enforcement agency: [employer] (ODS) 

## 2014-11-16 NOTE — ED Notes (Signed)
Pt restless, fighting staff, not wanting to lie down. Safety sitter remains with pt, Dr Demetrius CharityP notified, order obtained.

## 2014-11-16 NOTE — ED Notes (Signed)
Breakfast tray given to sitter 

## 2014-11-16 NOTE — ED Notes (Signed)
Skin tear re-dressed to left wrist. Site bleeding. 4x4 applied and wrapped with kerlex by Florentina AddisonKatie, RN.

## 2014-11-16 NOTE — ED Notes (Signed)
Justin Howard, Pt's Medical POA, called for status and will call at 0830 in am for pt status

## 2014-11-16 NOTE — Consult Note (Signed)
  Psychiatry: Follow-up for this 69 year old man in the emergency room with a history of dementia who presented with manic like behavior. Since then he has been more withdrawn and more confused. For several days now he has been rolling around on the floor showing no sign of being able to stand up despite the fact that he was standing fine when he came into the emergency room. On interview today he was able to speak back and forth with me but he wasn't making any sense. His carbamazepine level came back actually on the low end of normal so that doesn't seem to be a toxicity issue. No other new physical changes. Continue treatment with medicine for bipolar disorder and psychosis. We are still awaiting placement. He has been turned down by geriatric units for some reason. We are [facility] for [facility] at [facility]. Social work is involved in the care

## 2014-11-16 NOTE — ED Provider Notes (Signed)
-----------------------------------------   7:40 AM on 11/16/2014 -----------------------------------------   Blood pressure 138/83, pulse 80, temperature 98.3 F (36.8 C), temperature source Oral, resp. rate 18, height 6\' 1"  (1.854 m), weight 220 lb (99.791 kg), SpO2 99 %.  The patient had no acute events since last update.  Calm and cooperative at this time.  Disposition is pending per Psychiatry/Behavioral Medicine team recommendations.     Jeanmarie PlantJames A McShane, MD 11/16/14 [phone]

## 2014-11-16 NOTE — ED Notes (Signed)
Pt spoke with POA, was hoping to calm pt down with phone call, pt talked a few minutes then hung up. Pt appears to be calming down at this time.

## 2014-11-16 NOTE — ED Notes (Addendum)
BEHAVIORAL HEALTH ROUNDING Patient sleeping: NO Patient alert and oriented: YES Behavior appropriate: YES Describe behavior: No inappropriate or unacceptable behaviors noted at this time.  Nutrition and fluids offered: YES Toileting and hygiene offered: YES Sitter present: Behavioral tech rounding every 15 minutes on patient to ensure safety. Sitter at bedside.  Law enforcement present: YES Law enforcement agency: [employer] (ODS) 

## 2014-11-16 NOTE — ED Notes (Signed)
Pt kicking, crawling to door, dr Lenard LancePaduchowski notified, order obtained.

## 2014-11-16 NOTE — ED Notes (Signed)
Left knee and left forearm 4x4 applied to abraisons then wrapped with ace bandage bleeding is controlled

## 2014-11-16 NOTE — ED Notes (Signed)
IVC to be renewed on 11/17/14 pending placement

## 2014-11-16 NOTE — ED Notes (Signed)
BEHAVIORAL HEALTH ROUNDING Patient sleeping: NO Patient alert and oriented: YES Behavior appropriate: YES Describe behavior: No inappropriate or unacceptable behaviors noted at this time.  Nutrition and fluids offered: YES Toileting and hygiene offered: YES Sitter present: Interior and spatial designerBehavioral tech rounding every 15 minutes on patient to ensure safety/sitter in room. Law enforcement present: Loss adjuster, charteredYES Law enforcement agency: [employer] (ODS)

## 2014-11-16 NOTE — ED Notes (Addendum)
BEHAVIORAL HEALTH ROUNDING Patient sleeping: YES Patient alert and oriented: YES Behavior appropriate: YES Describe behavior: No inappropriate or unacceptable behaviors noted at this time.  Nutrition and fluids offered: YES Toileting and hygiene offered: YES Sitter present: Interior and spatial designerBehavioral tech rounding every 15 minutes on patient to ensure safety. Sitter at bedside.  Law enforcement present: Loss adjuster, charteredYES Law enforcement agency: [employer] (ODS)

## 2014-11-16 NOTE — ED Notes (Addendum)
BEHAVIORAL HEALTH ROUNDING Patient sleeping: no Patient alert and oriented: YES Behavior appropriate: YES Describe behavior: No inappropriate or unacceptable behaviors noted at this time.  Nutrition and fluids offered: YES Toileting and hygiene offered: YES Sitter present: Behavioral tech rounding every 15 minutes on patient to ensure safety. Sitter at bedside.  Law enforcement present: YES Law enforcement agency: [employer] (ODS) 

## 2014-11-16 NOTE — ED Notes (Signed)

## 2014-11-17 LAB — GLUCOSE, CAPILLARY
GLUCOSE-CAPILLARY: 140 mg/dL — AB (ref 65–99)
Glucose-Capillary: 99 mg/dL (ref 65–99)

## 2014-11-17 MED ORDER — ZIPRASIDONE MESYLATE 20 MG IM SOLR
INTRAMUSCULAR | Status: AC
  Administered 2014-11-17: 10 mg via INTRAMUSCULAR
  Filled 2014-11-17: qty 20

## 2014-11-17 MED ORDER — LORAZEPAM 1 MG PO TABS
1.0000 mg | ORAL_TABLET | Freq: Once | ORAL | Status: AC
  Administered 2014-11-17: 1 mg via ORAL

## 2014-11-17 MED ORDER — LORAZEPAM 2 MG/ML IJ SOLN
2.0000 mg | Freq: Once | INTRAMUSCULAR | Status: AC
  Administered 2014-11-17: 2 mg via INTRAMUSCULAR

## 2014-11-17 MED ORDER — LORAZEPAM 2 MG/ML IJ SOLN
INTRAMUSCULAR | Status: AC
  Administered 2014-11-17: 2 mg via INTRAMUSCULAR
  Filled 2014-11-17: qty 1

## 2014-11-17 MED ORDER — LORAZEPAM 1 MG PO TABS
1.0000 mg | ORAL_TABLET | Freq: Once | ORAL | Status: DC
  Filled 2014-11-17: qty 1

## 2014-11-17 MED ORDER — QUETIAPINE FUMARATE 200 MG PO TABS
ORAL_TABLET | ORAL | Status: AC
  Filled 2014-11-17: qty 1

## 2014-11-17 MED ORDER — METOPROLOL TARTRATE 25 MG PO TABS
ORAL_TABLET | ORAL | Status: AC
  Filled 2014-11-17: qty 1

## 2014-11-17 MED ORDER — ZIPRASIDONE MESYLATE 20 MG IM SOLR
10.0000 mg | Freq: Once | INTRAMUSCULAR | Status: AC
  Administered 2014-11-17: 10 mg via INTRAMUSCULAR

## 2014-11-17 MED ORDER — LORAZEPAM 1 MG PO TABS
ORAL_TABLET | ORAL | Status: AC
  Administered 2014-11-17: 1 mg via ORAL
  Filled 2014-11-17: qty 1

## 2014-11-17 MED ORDER — SACUBITRIL-VALSARTAN 49-51 MG PO TABS
ORAL_TABLET | ORAL | Status: AC
Start: 2014-11-17 — End: 2014-11-18
  Filled 2014-11-17: qty 1

## 2014-11-17 MED ORDER — LORAZEPAM 2 MG/ML IJ SOLN
2.0000 mg | Freq: Once | INTRAMUSCULAR | Status: AC
  Administered 2014-11-18: 2 mg via INTRAMUSCULAR
  Filled 2014-11-17: qty 1

## 2014-11-17 NOTE — Clinical Social Work Note (Signed)
Clinical Social Work Assessment  Patient Details  Name: Justin Howard Isenhower MRN: [id number] Date of Birth: December 04, 1945  Date of referral:  11/17/14               Reason for consult:  Facility Placement, Mental Health Concerns, Discharge Planning                Permission sought to share information with:  Facility Medical sales representativeContact Representative, Guardian Permission granted to share information::  Yes, Verbal Permission Granted  By Bonnye FavaGuardian/friend Ann Thornly [phone]  Name::        Agency::  [facility]  Relationship::     Contact Information:     Housing/Transportation Living arrangements for the past 2 months:  Skilled Building surveyorursing Facility Source of Information:  Medical Team, Power of Attorney, Facility, Psychiatric Consultation Patient Interpreter Needed:  None Criminal Activity/Legal Involvement Pertinent to Current Situation/Hospitalization:  No - Comment as needed Significant Relationships:  Friend, Other(Comment) (SNF staff) Lives with:  Facility Resident Do you feel safe going back to the place where you live?  Yes Need for family participation in patient care:  Yes (Comment) (Pt has a guardian)  Care giving concerns:  [facility]. [facility] mid-October per POA.    Social Worker assessment / plan:  CSW was referred to Pt after 1 week in the emergency room where he has been due to acute psychiatric needs that were not able to be managed [facility]. CSW is unable to obtain information from patient due to cognitive status.CSW followed up with Pt's guardian/friend for Pt's history. Pt's guardian/friend Jarvis Newcomernn Thornly reports that she has been Pt's friend for 25 years. She reports that Pt has 2 children, 1 son is deceased after an overdose and 1 daughter in [place] with whom he is estranged from according to guardian. Per guardian, Pt has [facility] years. He [facility] [place] due to aggressive behavior.  Pt's POA reports that Pt was able to leave facility to go out to eat with her this summer. She states that he has had 2 hospitalizations in [facility] regarding [facility], 1 was a 10 day stay where the patient was put in restraints at times during the stay. CSW agreed to follow [facility] with [facility]. Pt's behavior is too acute for SNF at this time, CSW will continue to follow up with tx team regarding Pt's progress.   Employment status:  Disabled (Comment on whether or not currently receiving Disability) Insurance information:  [place] Recommendations:  Skilled Nursing Facility Information / Referral to community resources:  Skilled Nursing Facility, Inpatient Psychiatric Care (Comment Required) [facility] program)  Patient/Family's Response to care:  Pt's guardian lives in [place], she was last in ED on Thursday to visit Pt. She continues to share that Pt's condition is not his baseline and that she has never seen him "this confused for this long."   Patient/Family's Understanding of and Emotional Response to Diagnosis, Current Treatment, and Prognosis:  Pt's guardian is very helpful and engaged in Pt's tx process. She calls daily for updates from ED nurses, she [facility]. Pt offers appropriate support to Pt when she is able.  Emotional Assessment Appearance:  Appears stated age, Disheveled Attitude/Demeanor/Rapport:  Irrational,  Reactive, Complaining, Combative Affect (typically observed):    Orientation:  Oriented to Self, Fluctuating Orientation (Suspected and/or reported Sundowners) Alcohol / Substance use:  Alcohol Use, Illicit Drugs (Last use in 2015 prior to facility placement) Psych involvement (Current and /or in the community):  Yes (Comment)  Discharge Needs  Concerns to be  addressed:  Mental Health Concerns, Adjustment to Illness Readmission within the last 30 days:  No Current discharge risk:  Chronically ill, Cognitively Impaired, Psychiatric Illness Barriers to Discharge:  Requiring sitter/restraints, Psych Bed not available, Continued Medical Work up   [employer], LCSW 11/17/2014, 9:25 PM

## 2014-11-17 NOTE — ED Notes (Addendum)
Pt continues to be agitated, attempting to climb out of bed. MD notified.

## 2014-11-17 NOTE — ED Notes (Addendum)
Pt medical POA, Winona Legatonn Thorley called and was updated on patient status.  Pt ate applesauce and drank 2 apple juice cups

## 2014-11-17 NOTE — ED Notes (Signed)
Pericare performed, pt changed and turned.

## 2014-11-17 NOTE — ED Provider Notes (Signed)
-----------------------------------------   6:22 AM on 11/17/2014 -----------------------------------------   Blood pressure 120/59, pulse 70, temperature 97.3 F (36.3 C), temperature source Oral, resp. rate 16, height 6\' 1"  (1.854 m), weight 220 lb (99.791 kg), SpO2 99 %.  Patient was given Ativan overnight for agitation.  Calm and cooperative at this time.  Disposition is pending per Psychiatry/Behavioral Medicine team recommendations.     Irean HongJade J Jecenia Leamer, MD 11/17/14 [phone]

## 2014-11-17 NOTE — ED Notes (Signed)
Pt sleeping ativan order DC's Dr Dolores FrameSung notified

## 2014-11-17 NOTE — Consult Note (Signed)
  Psychiatry: Follow-up for this 69 year old man who is being treated for bipolar disorder as well as dementia. Patient continues to be difficult to manage. He is not able to stand and not able to manage his ADLs. He kicks out and lashes out frequently. His speech is incoherent and he appears completely disoriented.  Spoke with social work about the current situation. We are working on trying to get him into an appropriate rehabilitation or assisted living setting. Current medication seems to of been of limited benefit. Tegretol level was low. Nevertheless he did seem to develop more behavior problems after starting the Tegretol. I will discontinue that medicine now. Continue monitoring and may consider some further change to medicine. We are also working on placement. Diagnosis bipolar disorder mixed manic and dementia

## 2014-11-17 NOTE — ED Notes (Signed)
Pt irritable, unable to follow instructions. Pt continues to try and get out of bed. Cannot be redirected with verbal commands at this time. Pt given PRN order of ativan. 1:1 sitter at bedside now. Pt left leg and right arm rewrapped with gauze due to skin tears previously on patient at time of this nurse initial assessment and arrival.

## 2014-11-17 NOTE — ED Notes (Signed)
Pt carried back to mattress after slowly sliding off, pt in and out of sleep, Vikki NT reporting pt unwilling to redirect or stay asleep

## 2014-11-17 NOTE — ED Notes (Signed)
Report from Rebecca, RN

## 2014-11-17 NOTE — ED Notes (Signed)
Pt having drink at this time with sitter at bedside assisting.

## 2014-11-17 NOTE — ED Notes (Signed)
Justin Howard, NT reports pt agitated, pt trying to stand, unable to redirect, Dr Dolores FrameSung notified

## 2014-11-18 LAB — URINALYSIS COMPLETE WITH MICROSCOPIC (ARMC ONLY)
BILIRUBIN URINE: NEGATIVE
Bacteria, UA: NONE SEEN
GLUCOSE, UA: NEGATIVE mg/dL
KETONES UR: NEGATIVE mg/dL
LEUKOCYTES UA: NEGATIVE
Nitrite: NEGATIVE
PH: 7 (ref 5.0–8.0)
Protein, ur: 100 mg/dL — AB
Specific Gravity, Urine: 1.014 (ref 1.005–1.030)

## 2014-11-18 LAB — GLUCOSE, CAPILLARY
GLUCOSE-CAPILLARY: 142 mg/dL — AB (ref 65–99)
GLUCOSE-CAPILLARY: 130 mg/dL — AB (ref 65–99)
Glucose-Capillary: 116 mg/dL — ABNORMAL HIGH (ref 65–99)

## 2014-11-18 MED ORDER — ASPIRIN EC 81 MG PO TBEC
DELAYED_RELEASE_TABLET | ORAL | Status: AC
  Administered 2014-11-18: 81 mg via ORAL
  Filled 2014-11-18: qty 1

## 2014-11-18 MED ORDER — METFORMIN HCL 500 MG PO TABS
ORAL_TABLET | ORAL | Status: AC
  Administered 2014-11-18: 500 mg via ORAL
  Filled 2014-11-18: qty 1

## 2014-11-18 MED ORDER — METOPROLOL TARTRATE 25 MG PO TABS
ORAL_TABLET | ORAL | Status: AC
  Administered 2014-11-18: 25 mg via ORAL
  Filled 2014-11-18: qty 1

## 2014-11-18 MED ORDER — VITAMIN B-1 100 MG PO TABS
ORAL_TABLET | ORAL | Status: AC
  Administered 2014-11-18: 100 mg via ORAL
  Filled 2014-11-18: qty 1

## 2014-11-18 MED ORDER — QUETIAPINE FUMARATE 200 MG PO TABS
ORAL_TABLET | ORAL | Status: AC
  Administered 2014-11-18: 200 mg via ORAL
  Filled 2014-11-18: qty 1

## 2014-11-18 MED ORDER — PANTOPRAZOLE SODIUM 40 MG PO TBEC
DELAYED_RELEASE_TABLET | ORAL | Status: AC
  Administered 2014-11-18: 40 mg via ORAL
  Filled 2014-11-18: qty 1

## 2014-11-18 MED ORDER — SACUBITRIL-VALSARTAN 49-51 MG PO TABS
ORAL_TABLET | ORAL | Status: AC
  Administered 2014-11-18: 1 via ORAL
  Filled 2014-11-18: qty 1

## 2014-11-18 MED ORDER — METFORMIN HCL 500 MG PO TABS
ORAL_TABLET | ORAL | Status: AC
  Filled 2014-11-18: qty 1

## 2014-11-18 MED ORDER — LORATADINE 10 MG PO TABS
ORAL_TABLET | ORAL | Status: AC
Start: 2014-11-18 — End: 2014-11-18
  Administered 2014-11-18: 10 mg via ORAL
  Filled 2014-11-18: qty 1

## 2014-11-18 MED ORDER — FERROUS SULFATE 325 (65 FE) MG PO TABS
ORAL_TABLET | ORAL | Status: AC
  Filled 2014-11-18: qty 1

## 2014-11-18 MED ORDER — CLOPIDOGREL BISULFATE 75 MG PO TABS
ORAL_TABLET | ORAL | Status: AC
  Administered 2014-11-18: 75 mg via ORAL
  Filled 2014-11-18: qty 1

## 2014-11-18 NOTE — ED Notes (Signed)
BEHAVIORAL HEALTH ROUNDING Patient sleeping: No. Patient alert and oriented: no Behavior appropriate: No.; If no, describe: Pt noted to be agitated, pt raises and lowers legs inappropriately. Pt tries to get out of bed.  Nutrition and fluids offered: Yes  Toileting and hygiene offered: Yes  Sitter present: Yes, Pt has Recruitment consultantsafety sitter at bedside Law enforcement present: Yes

## 2014-11-18 NOTE — ED Notes (Signed)
NAD noted at this time. Pt continues to have 1:1 safety sitter at this time. Pt calm with staff at this time. Will continue with 15 min safety checks.

## 2014-11-18 NOTE — ED Notes (Signed)

## 2014-11-18 NOTE — ED Notes (Signed)
Continued 1:1 safety sitters at this time. NAD noted. Pt awakens with mild stimuli. Pt continues to be disoriented, but can be redirected by staff.

## 2014-11-18 NOTE — ED Notes (Signed)
Pt bedding, diaper changed.  Pt cleaned after urination x 2 and defecation.

## 2014-11-18 NOTE — BH Assessment (Addendum)
CBC re[facility] to [facility] ([phone]) and confirmed it was received and pending review.

## 2014-11-18 NOTE — ED Provider Notes (Signed)
-----------------------------------------   6:52 AM on 11/18/2014 -----------------------------------------   Blood pressure 141/86, pulse 98, temperature 97.3 F (36.3 C), temperature source Oral, resp. rate 18, height 6\' 1"  (1.854 m), weight 220 lb (99.791 kg), SpO2 96 %.  Patient was given Ativan overnight for agitation.  Calm and cooperative at this time.  Disposition is pending per Psychiatry/Behavioral Medicine team recommendations.     Irean HongJade J Sung, MD 11/18/14 [phone]

## 2014-11-18 NOTE — ED Notes (Signed)
Pt continues to attempt to raise legs and get out of bed at this time. Pt is easily diverted back to bed. Pt is calm when addressing staff.

## 2014-11-18 NOTE — ED Notes (Signed)
NAD noted at this time. Pt continues to raise legs inappropriately. Pt continues to have 1:1 sitter at this time. Pt redirected easily by staff. Will continue with 15 min safety checks at this time.

## 2014-11-18 NOTE — ED Notes (Signed)
NAD noted at this time. Pt resting in bed. Pt given dinner tray at this time. Will continue with 1:1

## 2014-11-18 NOTE — BH Assessment (Signed)
Received phone call from [facility] ([phone]), requesting a stole sample to determine if their was blood in his bowel. Writer informed ER MD (Dr. Inocencio HomesGayle) and his nurse Aundra Millet(Megan).

## 2014-11-18 NOTE — ED Notes (Signed)
No change in patient condition. 1:1 sitter at bedside at this time. Respirations even and unlabored at this time.

## 2014-11-18 NOTE — ED Notes (Signed)
NAD noted at this at this time. Pt continues to have a 1:1 safety sitter at this time. Pt was given all of his lunch, pt managed to eat most of it. Pt is agitated but easily redirected at this time.

## 2014-11-18 NOTE — Consult Note (Signed)
  Psychiatry: Consult for this 69 year old man with dementia Hospital bipolar disorder. Patient continues to be very confused in the emergency room. Lashes out intermittently. Agitated. Discontinuation of Tegretol so far seems to of made no difference one way or another. [facility]. Other geriatric units have turned him down. Continue monitoring and treatment in the emergency room for now.

## 2014-11-18 NOTE — ED Notes (Signed)
No physical distress noted at this time. Pt in bed with safety sitter at bedside. Pt raises and lowers legs and attempts to get out of bed. Pt appears mildly calm and this time. Pt refused to eat breakfast this morning.

## 2014-11-18 NOTE — ED Notes (Signed)
NAD noted at this time. 1:1 safety sitter at bedside. Pt resting in bed with lights off and TV.

## 2014-11-18 NOTE — ED Notes (Signed)
NAD noted at this time. Pt resting in bed with 1:1 safety sitter at bedside. Pt encouraged to drink water at this time, pt drank approx 240 oz of water. Pt also ate applesauce without difficulty at this time. Pt compliant with medications which were given in applesauce.

## 2014-11-19 LAB — OCCULT BLOOD X 1 CARD TO LAB, STOOL: Fecal Occult Bld: NEGATIVE

## 2014-11-19 LAB — GLUCOSE, CAPILLARY: Glucose-Capillary: 90 mg/dL (ref 65–99)

## 2014-11-19 MED ORDER — ASPIRIN EC 81 MG PO TBEC
DELAYED_RELEASE_TABLET | ORAL | Status: AC
  Administered 2014-11-19: 81 mg via ORAL
  Filled 2014-11-19: qty 1

## 2014-11-19 MED ORDER — FERROUS SULFATE 325 (65 FE) MG PO TABS
ORAL_TABLET | ORAL | Status: AC
Start: 2014-11-19 — End: 2014-11-19
  Filled 2014-11-19: qty 1

## 2014-11-19 MED ORDER — VITAMIN B-1 100 MG PO TABS
ORAL_TABLET | ORAL | Status: AC
  Filled 2014-11-19: qty 1

## 2014-11-19 MED ORDER — DIAZEPAM 5 MG PO TABS
ORAL_TABLET | ORAL | Status: AC
  Filled 2014-11-19: qty 1

## 2014-11-19 MED ORDER — METFORMIN HCL 500 MG PO TABS
ORAL_TABLET | ORAL | Status: AC
  Administered 2014-11-19: 500 mg via ORAL
  Filled 2014-11-19: qty 1

## 2014-11-19 MED ORDER — ZIPRASIDONE MESYLATE 20 MG IM SOLR
10.0000 mg | Freq: Once | INTRAMUSCULAR | Status: AC
  Administered 2014-11-19: 10 mg via INTRAMUSCULAR
  Filled 2014-11-19: qty 20

## 2014-11-19 MED ORDER — METOPROLOL TARTRATE 25 MG PO TABS
ORAL_TABLET | ORAL | Status: AC
  Filled 2014-11-19: qty 1

## 2014-11-19 MED ORDER — ASPIRIN 81 MG PO CHEW
CHEWABLE_TABLET | ORAL | Status: AC
  Filled 2014-11-19: qty 1

## 2014-11-19 MED ORDER — METFORMIN HCL 500 MG PO TABS
ORAL_TABLET | ORAL | Status: AC
  Administered 2014-11-19: 500 mg
  Filled 2014-11-19: qty 1

## 2014-11-19 MED ORDER — SACUBITRIL-VALSARTAN 49-51 MG PO TABS
ORAL_TABLET | ORAL | Status: AC
  Filled 2014-11-19: qty 1

## 2014-11-19 MED ORDER — PANTOPRAZOLE SODIUM 40 MG PO TBEC
DELAYED_RELEASE_TABLET | ORAL | Status: AC
  Filled 2014-11-19: qty 1

## 2014-11-19 MED ORDER — LORATADINE 10 MG PO TABS
ORAL_TABLET | ORAL | Status: AC
  Filled 2014-11-19: qty 1

## 2014-11-19 MED ORDER — QUETIAPINE FUMARATE 200 MG PO TABS
ORAL_TABLET | ORAL | Status: AC
  Filled 2014-11-19: qty 1

## 2014-11-19 MED ORDER — CLOPIDOGREL BISULFATE 75 MG PO TABS
ORAL_TABLET | ORAL | Status: AC
  Filled 2014-11-19: qty 1

## 2014-11-19 MED ORDER — DIAZEPAM 5 MG PO TABS
5.0000 mg | ORAL_TABLET | Freq: Once | ORAL | Status: AC
  Administered 2014-11-23: 5 mg via ORAL
  Filled 2014-11-19: qty 1

## 2014-11-19 NOTE — ED Notes (Signed)

## 2014-11-19 NOTE — ED Provider Notes (Signed)
Patient's been stable overnight, no evidence. Patient remains stable for psychiatric evaluation  Emily FilbertJonathan E Aeden Matranga, MD 11/19/14 [phone]

## 2014-11-19 NOTE — ED Notes (Addendum)
Dressing removed from right arm, pt with 1.2cm circular scabbed circular area noted to right lower posterior forearm. No purulent drainage noted. Pt with bruising in multiple areas noted to bilateral forearms. Pt sipping on additional water at this time. Pt alert to self only. Pt cooperative. perrl 3mm. resps unlabored. Pt with scabbed area to left lateral anterior knee 1.8 cm circumfrence, dark purple bruise noted medially to scabbed area on knee. Pt moving all extremities without difficulty.

## 2014-11-19 NOTE — ED Notes (Signed)
Pt awake, pt offered po fluids by sitter. Pt has consumed 8 ounces of water at this time.

## 2014-11-19 NOTE — ED Notes (Signed)
Pt eating applesauce and drinking po fluids.

## 2014-11-19 NOTE — ED Notes (Signed)
Pt being assisted with meals

## 2014-11-19 NOTE — BH Assessment (Addendum)
Writer called to follow up with the status of the patient being on the wait list. Writer was told earlier today, was waiting on whether or not he had blood in his stole.  This evening when writer called to check on the status of referral, was [facility] [phone]), the referral was discarded due to not getting the information requested. Writer resubmitted verbal screening and [facility].  [employer] Tracking # [id number]

## 2014-11-19 NOTE — Consult Note (Signed)
  Psychiatry: Patient with bipolar type symptoms and dementia continues to be confused and agitated. Medically he appears to be stable. Still delusional and intermittently delirious. We are working on possible referral to long-term hospitalization as he is been turned down by multiple other facilities.

## 2014-11-19 NOTE — ED Notes (Signed)
Patient assigned to appropriate care area. Patient oriented to unit/care area: Informed that, for their safety, care areas are designed for safety and monitored by security cameras at all times; and visiting hours explained to patient. Patient verbalizes understanding, and verbal contract for safety obtained. 

## 2014-11-19 NOTE — ED Notes (Signed)
BEHAVIORAL HEALTH ROUNDING Patient sleeping: No. Patient alert and oriented: no Behavior appropriate: No.; If no, describe: trying to get out of bed Nutrition and fluids offered: Yes  Toileting and hygiene offered: Yes  Sitter present: yes Law enforcement present: Yes

## 2014-11-19 NOTE — ED Notes (Signed)
BEHAVIORAL HEALTH ROUNDING Patient sleeping: Yes.   Patient alert and oriented:no Behavior appropriate: Yes.  ; If no, describe:  Nutrition and fluids offered: Yes  Toileting and hygiene offered: Yes  Sitter present: yes Law enforcement present: Yes  

## 2014-11-19 NOTE — ED Notes (Signed)
BEHAVIORAL HEALTH ROUNDING Patient sleeping: Yes.   Patient alert and oriented: not applicable Behavior appropriate: Yes.  ; If no, describe:  Nutrition and fluids offered: Yes  Toileting and hygiene offered: Yes  Sitter present: yes Law enforcement present: Yes  

## 2014-11-19 NOTE — ED Notes (Signed)
BEHAVIORAL HEALTH ROUNDING Patient sleeping: No. Patient alert and oriented: no Behavior appropriate: No.; If no, describe: agigated, will not follow commands Nutrition and fluids offered: Yes  Toileting and hygiene offered: Yes  Sitter present: yes Law enforcement present: Yes

## 2014-11-19 NOTE — ED Notes (Signed)
Pt sleeping, resps unlabored. Pt with safety sitter, Brie, cna at bedside. Pt with dressing in place to right wrist, will assess skin when waking pt for hs medication. Skin pwd.

## 2014-11-19 NOTE — ED Notes (Signed)
Pt becoming agigated, will not take po med, im med gicven

## 2014-11-19 NOTE — ED Notes (Signed)
Pt awake, sitter at bedside. Pt sipping on po fluids.

## 2014-11-19 NOTE — ED Notes (Signed)
Pt up to restroom to provide stool sample and to urinate.

## 2014-11-19 NOTE — ED Notes (Signed)
BEHAVIORAL HEALTH ROUNDING Patient sleeping: No. Patient alert and oriented: no Behavior appropriate: No.; If no, describe: trying to get out of bed Nutrition and fluids offered: Yes  Toileting and hygiene offered: Yes  Sitter present: yes Law enforcement present: Yes  

## 2014-11-20 LAB — GLUCOSE, CAPILLARY
GLUCOSE-CAPILLARY: 99 mg/dL (ref 65–99)
GLUCOSE-CAPILLARY: 135 mg/dL — AB (ref 65–99)

## 2014-11-20 MED ORDER — QUETIAPINE FUMARATE 200 MG PO TABS
ORAL_TABLET | ORAL | Status: AC
  Filled 2014-11-20: qty 1

## 2014-11-20 MED ORDER — VITAMIN B-1 100 MG PO TABS
ORAL_TABLET | ORAL | Status: AC
  Administered 2014-11-20: 100 mg via ORAL
  Filled 2014-11-20: qty 1

## 2014-11-20 MED ORDER — METOPROLOL TARTRATE 25 MG PO TABS
ORAL_TABLET | ORAL | Status: AC
  Filled 2014-11-20: qty 1

## 2014-11-20 MED ORDER — PANTOPRAZOLE SODIUM 40 MG PO TBEC
DELAYED_RELEASE_TABLET | ORAL | Status: AC
  Administered 2014-11-20: 40 mg via ORAL
  Filled 2014-11-20: qty 1

## 2014-11-20 MED ORDER — CLOPIDOGREL BISULFATE 75 MG PO TABS
ORAL_TABLET | ORAL | Status: AC
  Administered 2014-11-20: 75 mg via ORAL
  Filled 2014-11-20: qty 1

## 2014-11-20 MED ORDER — METOPROLOL TARTRATE 25 MG PO TABS
ORAL_TABLET | ORAL | Status: AC
  Administered 2014-11-20: 25 mg via ORAL
  Filled 2014-11-20: qty 1

## 2014-11-20 MED ORDER — SACUBITRIL-VALSARTAN 49-51 MG PO TABS
ORAL_TABLET | ORAL | Status: AC
  Filled 2014-11-20: qty 1

## 2014-11-20 MED ORDER — LORATADINE 10 MG PO TABS
ORAL_TABLET | ORAL | Status: AC
  Administered 2014-11-20: 10 mg via ORAL
  Filled 2014-11-20: qty 1

## 2014-11-20 MED ORDER — METFORMIN HCL 500 MG PO TABS
ORAL_TABLET | ORAL | Status: AC
  Administered 2014-11-20: 500 mg via ORAL
  Filled 2014-11-20: qty 1

## 2014-11-20 MED ORDER — FERROUS SULFATE 325 (65 FE) MG PO TABS
ORAL_TABLET | ORAL | Status: AC
  Administered 2014-11-20: 325 mg via ORAL
  Filled 2014-11-20: qty 1

## 2014-11-20 MED ORDER — SACUBITRIL-VALSARTAN 49-51 MG PO TABS
ORAL_TABLET | ORAL | Status: AC
  Administered 2014-11-20: 1 via ORAL
  Filled 2014-11-20: qty 1

## 2014-11-20 MED ORDER — ASPIRIN EC 81 MG PO TBEC
DELAYED_RELEASE_TABLET | ORAL | Status: AC
  Administered 2014-11-20: 81 mg via ORAL
  Filled 2014-11-20: qty 1

## 2014-11-20 NOTE — ED Notes (Signed)
Pt continues to sleep   Sitter at bedside

## 2014-11-20 NOTE — ED Notes (Signed)
ivc /placement pending 

## 2014-11-20 NOTE — Progress Notes (Signed)
[facility] referral refaxed by [facility] on 11/19/14.  [facility] ([phone]), requested a stole sample on 11/18/14 to determine if there was blood in patient's bowel.  [facility], spoke to [place]. informed CSW patient's information is currently waiting for medical to review.  Informed Alla GermanGilchrist the results of the stool sample was available.   CSW faxed a copy of the lab results which were negative.   Sammuel Hineseborah Keyli Duross. Theresia MajorsLCSWA, MSW Clinical Social Work Department [phone] 11:59 AM

## 2014-11-20 NOTE — ED Provider Notes (Signed)
-----------------------------------------   5:51 AM on 11/20/2014 -----------------------------------------   Blood pressure 141/86, pulse 78, temperature 97.5 F (36.4 C), temperature source Axillary, resp. rate 14, height 6\' 1"  (1.854 m), weight 220 lb (99.791 kg), SpO2 100 %.  The patient had no acute events since last update.  Calm and cooperative at this time.  Disposition is pending per Psychiatry/Behavioral Medicine team recommendations.     Sharman CheekPhillip Jamelle Goldston, MD 11/20/14 [phone]

## 2014-11-20 NOTE — ED Notes (Signed)
Pt continues to sleep, sitter at bedside. Report to bill, rn.

## 2014-11-20 NOTE — ED Notes (Signed)
Pt sleeping. resps unlabored.  

## 2014-11-20 NOTE — ED Notes (Signed)
Pt resting, occasionally verbal with sitter at bedside. Pt intermittently sipping on po fluids provided by sitter.

## 2014-11-20 NOTE — ED Notes (Signed)
Pt sleeping, resps unlabored. Sitter at bedside.

## 2014-11-21 LAB — GLUCOSE, CAPILLARY
Glucose-Capillary: 101 mg/dL — ABNORMAL HIGH (ref 65–99)
Glucose-Capillary: 137 mg/dL — ABNORMAL HIGH (ref 65–99)
Glucose-Capillary: 119 mg/dL — ABNORMAL HIGH (ref 65–99)
Glucose-Capillary: 109 mg/dL — ABNORMAL HIGH (ref 65–99)

## 2014-11-21 MED ORDER — SACUBITRIL-VALSARTAN 49-51 MG PO TABS
ORAL_TABLET | ORAL | Status: AC
  Administered 2014-11-21: 1 via ORAL
  Filled 2014-11-21: qty 1

## 2014-11-21 MED ORDER — DIAZEPAM 5 MG PO TABS
5.0000 mg | ORAL_TABLET | Freq: Once | ORAL | Status: AC
  Administered 2014-11-21: 5 mg via ORAL
  Filled 2014-11-21: qty 1

## 2014-11-21 MED ORDER — ZIPRASIDONE MESYLATE 20 MG IM SOLR
INTRAMUSCULAR | Status: AC
  Filled 2014-11-21: qty 20

## 2014-11-21 MED ORDER — CLOPIDOGREL BISULFATE 75 MG PO TABS
ORAL_TABLET | ORAL | Status: AC
  Administered 2014-11-21: 75 mg via ORAL
  Filled 2014-11-21: qty 1

## 2014-11-21 MED ORDER — LORAZEPAM 1 MG PO TABS
ORAL_TABLET | ORAL | Status: AC
  Filled 2014-11-21: qty 1

## 2014-11-21 MED ORDER — ZIPRASIDONE MESYLATE 20 MG IM SOLR
INTRAMUSCULAR | Status: AC
  Administered 2014-11-21: 10 mg via INTRAMUSCULAR
  Filled 2014-11-21: qty 20

## 2014-11-21 MED ORDER — LORAZEPAM 2 MG/ML IJ SOLN
2.0000 mg | Freq: Once | INTRAMUSCULAR | Status: AC
  Administered 2014-11-21: 2 mg via INTRAMUSCULAR
  Filled 2014-11-21: qty 1

## 2014-11-21 MED ORDER — LORATADINE 10 MG PO TABS
ORAL_TABLET | ORAL | Status: AC
  Administered 2014-11-21: 10 mg via ORAL
  Filled 2014-11-21: qty 1

## 2014-11-21 MED ORDER — ZIPRASIDONE MESYLATE 20 MG IM SOLR
20.0000 mg | Freq: Once | INTRAMUSCULAR | Status: AC
  Administered 2014-11-21: 20 mg via INTRAMUSCULAR

## 2014-11-21 MED ORDER — ASPIRIN EC 81 MG PO TBEC
DELAYED_RELEASE_TABLET | ORAL | Status: AC
  Filled 2014-11-21: qty 1

## 2014-11-21 MED ORDER — METFORMIN HCL 500 MG PO TABS
ORAL_TABLET | ORAL | Status: AC
  Filled 2014-11-21: qty 1

## 2014-11-21 MED ORDER — METOPROLOL TARTRATE 25 MG PO TABS
ORAL_TABLET | ORAL | Status: AC
  Administered 2014-11-21: 25 mg via ORAL
  Filled 2014-11-21: qty 1

## 2014-11-21 MED ORDER — LORAZEPAM 1 MG PO TABS
1.0000 mg | ORAL_TABLET | Freq: Once | ORAL | Status: AC
  Administered 2014-11-21: 1 mg via ORAL

## 2014-11-21 MED ORDER — FERROUS SULFATE 325 (65 FE) MG PO TABS
ORAL_TABLET | ORAL | Status: AC
  Administered 2014-11-21: 325 mg via ORAL
  Filled 2014-11-21: qty 1

## 2014-11-21 MED ORDER — ZIPRASIDONE MESYLATE 20 MG IM SOLR
10.0000 mg | Freq: Once | INTRAMUSCULAR | Status: AC
  Administered 2014-11-21: 10 mg via INTRAMUSCULAR

## 2014-11-21 MED ORDER — PANTOPRAZOLE SODIUM 40 MG PO TBEC
DELAYED_RELEASE_TABLET | ORAL | Status: AC
  Administered 2014-11-21: 40 mg via ORAL
  Filled 2014-11-21: qty 1

## 2014-11-21 MED ORDER — METFORMIN HCL 500 MG PO TABS
ORAL_TABLET | ORAL | Status: AC
  Administered 2014-11-21: 500 mg via ORAL
  Filled 2014-11-21: qty 1

## 2014-11-21 MED ORDER — ASPIRIN 81 MG PO CHEW
CHEWABLE_TABLET | ORAL | Status: AC
  Filled 2014-11-21: qty 1

## 2014-11-21 MED ORDER — VITAMIN B-1 100 MG PO TABS
ORAL_TABLET | ORAL | Status: AC
  Administered 2014-11-21: 100 mg via ORAL
  Filled 2014-11-21: qty 1

## 2014-11-21 NOTE — ED Notes (Addendum)
Patient became agitated, attempts were made to speak in a calming manner, room was darkened. Patient kicked out at this RN and hit out striking ED tech. Dr. Carollee MassedKaminski aware and ordered med. Geodon 20mg  IM given.

## 2014-11-21 NOTE — ED Provider Notes (Signed)
-----------------------------------------   10:19 PM on 11/21/2014 -----------------------------------------  Mr. Claudette LawsWatson has been having a difficult time through the later part of this shift. She has become more agitated and is been hollering out. He has required additional staff to keep him safe in bed. With this increased agitation we treated him with Geodon, 20 mg IM. This helped him relax initially, but the benefit was relatively short-lived and within an hour he will cup and was yelling and needing further attention for his safety. He was then treated with 2 mg of Ativan IM. This slowly has helped him and he is no longer yelling. He is still awake and alert but agitated.  Due to the physical restraint of the caretakers holding him, he has some skin tears on his arm. He has a notable skin tear on the left thumb base. This was dressed earlier in the day but the dressing has now become more saturated with blood. He has a small tear at the base of the right thumb as well. Once the patient is further comfortable, we will attempt to redress and possibly close the skin tears.  ----------------------------------------- 11:40 PM on 11/21/2014 -----------------------------------------  Patient has been more calm and relaxed over the past hour.  Darien Ramusavid W Dorse Locy, MD 11/21/14 [phone]

## 2014-11-21 NOTE — ED Provider Notes (Signed)
-----------------------------------------   6:55 AM on 11/21/2014 -----------------------------------------   Blood pressure 127/76, pulse 77, temperature 98.2 F (36.8 C), temperature source Oral, resp. rate 14, height 6\' 1"  (1.854 m), weight 220 lb (99.791 kg), SpO2 99 %.  The patient became agitated overnight, was not able to de-escalate. 10 mg of IM Geodon given with good result. He is now resting comfortably.  Calm and cooperative at this time.  Disposition is pending per Psychiatry/Behavioral Medicine team recommendations.     Gayla DossEryka A Otilia Kareem, MD 11/21/14 [phone]

## 2014-11-21 NOTE — ED Notes (Signed)
Patient is remaining in bed, awake, but drowsy. Sitter remains at bedside.

## 2014-11-21 NOTE — ED Notes (Signed)
Pt taken to shower with sitter and [employer] security.

## 2014-11-21 NOTE — ED Notes (Addendum)
Report from sonja, rn. Pt in bed, with sitter at bedside. Pt moving legs, bilateral forearm dressings in place. Pt with multiple skin tears to bilateral forearms. Pt alert to self only at this time. resps unlabored. Skin warm and dry, normal color with exception of skin tears. Pt with scabbed area of approx 1.5 cm to left lateral lower knee.  Pt with are to left medial knee approx 1.6 cm in circumference that is open like an abrasion, no drainage present. Pt with scabbed area noted to right second top of toe, approx 0.5cm. Breath sounds clear, 3+ brachial pulses noted, cap refill to fingers of approx 3 seconds.

## 2014-11-21 NOTE — ED Notes (Signed)
Patient had gotten out of bed, was threatening Engineer, building servicessecurity staff and sitter. Patient visibly agitated. Attempted to deescalate patient, turned off light, reduced stimuli by shutting door with sitter at bedside. Patient continued to speak loudly and refused to stay in bed. Dr. Inocencio HomesGayle notified. Geodon given as ordered.

## 2014-11-21 NOTE — Progress Notes (Addendum)
[facility] patient's behavior in ED of threatening security staff and sitter.  [facility] spoke to [place] to inform him the note was faxed.  Per Alla GermanGilchrist patient's referral is still under medical review.  CSW spoke to patient PAO [name] who provided additional information on patient to get a better understanding of his baseline.  Per [name] patient this behavioral is new for patient.  He is confused at baseline, is able to carry on a conversation but not usually agitated or has aggression.  Some of patient's conversation is true and some not.  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  Patient likes to talk about making money and usually socially interactive.   Per POA patient [facility] past only to [facility] and [facility].  The goal is to get [facility] once he is psych stable.  Patient is currently being follow by psych in the ED and [facility].  All requested information and current notes of patient's behavior have been [facility].  CSW will continues to follow patient to assist with disposition  Sammuel Hineseborah Tayshun Gappa. LCSWA, MSW Clinical Social Work Department [phone] 11:07 AM

## 2014-11-21 NOTE — ED Notes (Signed)
Patient urinated while laying in bed. Patient had been getting up to go to restroom. Bedding changed.

## 2014-11-22 DIAGNOSIS — F312 Bipolar disorder, current episode manic severe with psychotic features: Secondary | ICD-10-CM | POA: Diagnosis not present

## 2014-11-22 LAB — CBC WITH DIFFERENTIAL/PLATELET
Basophils Absolute: 0 10*3/uL (ref 0–0.1)
Basophils Relative: 1 %
EOS ABS: 0.1 10*3/uL (ref 0–0.7)
EOS PCT: 2 %
HCT: 30.1 % — ABNORMAL LOW (ref 40.0–52.0)
Hemoglobin: 9.9 g/dL — ABNORMAL LOW (ref 13.0–18.0)
LYMPHS ABS: 1.2 10*3/uL (ref 1.0–3.6)
Lymphocytes Relative: 28 %
MCH: 30 pg (ref 26.0–34.0)
MCHC: 33 g/dL (ref 32.0–36.0)
MCV: 91 fL (ref 80.0–100.0)
Monocytes Absolute: 0.3 10*3/uL (ref 0.2–1.0)
Monocytes Relative: 7 %
Neutro Abs: 2.8 10*3/uL (ref 1.4–6.5)
Neutrophils Relative %: 62 %
PLATELETS: 138 10*3/uL — AB (ref 150–440)
RBC: 3.31 MIL/uL — AB (ref 4.40–5.90)
RDW: 16.6 % — ABNORMAL HIGH (ref 11.5–14.5)
WBC: 4.4 10*3/uL (ref 3.8–10.6)

## 2014-11-22 LAB — IRON AND TIBC
IRON: 46 ug/dL (ref 45–182)
Saturation Ratios: 19 % (ref 17.9–39.5)
TIBC: 247 ug/dL — ABNORMAL LOW (ref 250–450)
UIBC: 201 ug/dL

## 2014-11-22 LAB — LACTATE DEHYDROGENASE: LDH: 172 U/L (ref 98–192)

## 2014-11-22 LAB — VITAMIN B12: Vitamin B-12: 639 pg/mL (ref 180–914)

## 2014-11-22 LAB — FERRITIN: Ferritin: 238 ng/mL (ref 24–336)

## 2014-11-22 LAB — GLUCOSE, CAPILLARY
GLUCOSE-CAPILLARY: 128 mg/dL — AB (ref 65–99)
GLUCOSE-CAPILLARY: 200 mg/dL — AB (ref 65–99)

## 2014-11-22 LAB — FOLATE: FOLATE: 21.1 ng/mL (ref >5.9)

## 2014-11-22 MED ORDER — SACUBITRIL-VALSARTAN 49-51 MG PO TABS
ORAL_TABLET | ORAL | Status: AC
  Administered 2014-11-22: 1 via ORAL
  Filled 2014-11-22: qty 1

## 2014-11-22 MED ORDER — FERROUS SULFATE 325 (65 FE) MG PO TABS
ORAL_TABLET | ORAL | Status: AC
  Administered 2014-11-22: 325 mg via ORAL
  Filled 2014-11-22: qty 1

## 2014-11-22 MED ORDER — METFORMIN HCL 500 MG PO TABS
ORAL_TABLET | ORAL | Status: AC
  Filled 2014-11-22: qty 1

## 2014-11-22 MED ORDER — LORAZEPAM 2 MG PO TABS
2.0000 mg | ORAL_TABLET | Freq: Once | ORAL | Status: AC
  Administered 2014-11-22: 2 mg via ORAL

## 2014-11-22 MED ORDER — ASPIRIN EC 81 MG PO TBEC
DELAYED_RELEASE_TABLET | ORAL | Status: AC
  Administered 2014-11-22: 81 mg via ORAL
  Filled 2014-11-22: qty 1

## 2014-11-22 MED ORDER — METOPROLOL TARTRATE 25 MG PO TABS
ORAL_TABLET | ORAL | Status: AC
  Administered 2014-11-22: 25 mg via ORAL
  Filled 2014-11-22: qty 1

## 2014-11-22 MED ORDER — CLOPIDOGREL BISULFATE 75 MG PO TABS
ORAL_TABLET | ORAL | Status: AC
  Administered 2014-11-22: 75 mg via ORAL
  Filled 2014-11-22: qty 1

## 2014-11-22 MED ORDER — QUETIAPINE FUMARATE 25 MG PO TABS
100.0000 mg | ORAL_TABLET | Freq: Two times a day (BID) | ORAL | Status: DC
  Administered 2014-11-23 – 2014-11-24 (×4): 100 mg via ORAL
  Filled 2014-11-22 (×5): qty 4

## 2014-11-22 MED ORDER — QUETIAPINE FUMARATE 200 MG PO TABS
ORAL_TABLET | ORAL | Status: AC
  Administered 2014-11-22: 200 mg via ORAL
  Filled 2014-11-22: qty 1

## 2014-11-22 MED ORDER — LORAZEPAM 2 MG PO TABS
ORAL_TABLET | ORAL | Status: AC
  Administered 2014-11-22: 2 mg via ORAL
  Filled 2014-11-22: qty 1

## 2014-11-22 MED ORDER — LORATADINE 10 MG PO TABS
ORAL_TABLET | ORAL | Status: AC
  Administered 2014-11-22: 10 mg via ORAL
  Filled 2014-11-22: qty 1

## 2014-11-22 MED ORDER — VITAMIN B-1 100 MG PO TABS
ORAL_TABLET | ORAL | Status: AC
  Administered 2014-11-22: 100 mg via ORAL
  Filled 2014-11-22: qty 1

## 2014-11-22 MED ORDER — PANTOPRAZOLE SODIUM 40 MG PO TBEC
DELAYED_RELEASE_TABLET | ORAL | Status: AC
  Administered 2014-11-22: 40 mg via ORAL
  Filled 2014-11-22: qty 1

## 2014-11-22 MED ORDER — METFORMIN HCL 500 MG PO TABS
ORAL_TABLET | ORAL | Status: AC
  Administered 2014-11-22: 500 mg via ORAL
  Filled 2014-11-22: qty 1

## 2014-11-22 NOTE — ED Notes (Signed)
Pt awake, pt urinating over brief. Pt cleansed of urine, new brief and pants applied. Pt with dried blood noted to left lower arm dressing. Dressing removed, pt with skin tear noted to left mid medial forearm approx 1.8 cm. Pt with skin tear to right lateral wrist approx 2.2cm. Xeroform applied over skin tears, gauze and kling wrap applied over xeroform. Pt moving all fingers. No s/s of infection noted to skin tears. Pt with bruising from yellow to purple noted to left forearm.

## 2014-11-22 NOTE — ED Notes (Signed)
Pt has become more agitated since speaking with family member on the phone. Speaking in a louder tone and wanting his shoes so that he can leave, pt is talking about different random things, having to settle a sales agreement with someone, someone taking something from him..Marland Kitchen

## 2014-11-22 NOTE — BH Assessment (Signed)
Last 48 hours of ER notes, [facility].

## 2014-11-22 NOTE — BH Specialist Note (Signed)
Referral information has been [facility] to [facility], [facility], [facility], and [facility].

## 2014-11-22 NOTE — Consult Note (Signed)
Hematology consult called for mild anemia prior to transfer to long-term facility. Patient has not had laboratory work since November 14, 2014 at which time his hemoglobin was 8.5.  Laboratory work has been ordered. Will comment further once resulted.  Appreciate consult.

## 2014-11-22 NOTE — ED Notes (Signed)
Pt was wet with urine, pt cleaned and new clothes and linen applied to pt and bedding. Pt repositioned for comfort.

## 2014-11-22 NOTE — BH Assessment (Addendum)
Called and confirmed with [facility] ([phone]), the patient is on their wait list. Nothing else needed at this time for [facility]. 

## 2014-11-22 NOTE — ED Notes (Signed)
Pt speaking with friend Verlin GrillsMike Walls on the phone. Sitter is at the bedside.

## 2014-11-22 NOTE — ED Notes (Signed)
Was going to pt room to change dressing to BL FA, pt is sound asleep resting quietly, will attempt once the pt has awakened.

## 2014-11-22 NOTE — ED Notes (Signed)
Pt eating lunch at present. 

## 2014-11-22 NOTE — ED Provider Notes (Signed)
-----------------------------------------   6:32 AM on 11/22/2014 -----------------------------------------   Blood pressure 137/79, pulse 79, temperature 97.8 F (36.6 C), temperature source Oral, resp. rate 18, height 6\' 1"  (1.854 m), weight 220 lb (99.791 kg), SpO2 98 %.  Patient did not require sedation on my shift.  Some of his skin tears began to rebleed secondary to patient accidentally striking his extremities when agitated. Wounds were redressed by nurse. Bandages remain clean and nonbloody. Calm and cooperative at this time.  Disposition is pending per Psychiatry/Behavioral Medicine team recommendations.     Irean HongJade J Sung, MD 11/22/14 [phone]

## 2014-11-22 NOTE — Consult Note (Signed)
  Psychiatry: Follow-up for this 69 year old man with dementia and possible bipolar disorder. Case reviewed with emergency room physician patient interviewed. Apparently the patient has been more agitated over the weekend. When I came to see him he was wide awake but disorganized and disoriented. He continues to have the odd movements that I noticed him starting to have on the second hospital day which looked very much like tardive dyskinesia. He is disoriented as to where he is and what his situation is. Talking a lot. Affect upbeat. He is not currently violent but I can see where he is difficult to manage.  I'm going to add 100 mg of Seroquel twice a day to be given orally during the day in addition to his current 200 mg at night for his agitated bipolar disorder. This appears to of been effective for him at night. We are continuing to look into possible transfer to a long-term facility. No change to diagnosis.

## 2014-11-22 NOTE — ED Notes (Signed)
Pt sleeping. Sitter remains at bedside.

## 2014-11-22 NOTE — ED Notes (Signed)
ED BHU PLACEMENT JUSTIFICATION Is the patient under IVC or is there intent for IVC: Yes.   Is the patient medically cleared: Yes.   Is there vacancy in the ED BHU: Yes.   Is the population mix appropriate for patient: No. Is the patient awaiting placement in inpatient or outpatient setting: Yes.   Has the patient had a psychiatric consult: Yes.   Survey of unit performed for contraband, proper placement and condition of furniture, tampering with fixtures in bathroom, shower, and each patient room: Yes.  ; Findings:  APPEARANCE/BEHAVIOR calm and cooperative NEURO ASSESSMENT Orientation: person Hallucinations: No.None noted (Hallucinations) Speech: Normal Gait: unable to stand RESPIRATORY ASSESSMENT Normal expansion.  Clear to auscultation.  No rales, rhonchi, or wheezing. CARDIOVASCULAR ASSESSMENT regular rate and rhythm, S1, S2 normal, no murmur, click, rub or gallop GASTROINTESTINAL ASSESSMENT soft, nontender, BS WNL, no r/g EXTREMITIES normal strength, tone, and muscle mass PLAN OF CARE Provide calm/safe environment. Vital signs assessed twice daily. ED BHU Assessment once each 12-hour shift. Collaborate with intake RN daily or as condition indicates. Assure the ED provider has rounded once each shift. Provide and encourage hygiene. Provide redirection as needed. Assess for escalating behavior; address immediately and inform ED provider.  Assess family dynamic and appropriateness for visitation as needed: No.; If necessary, describe findings: family unavailable at thsi time Educate the patient/family about BHU procedures/visitation: No.; If necessary, describe findings: family unavailable at this time.

## 2014-11-22 NOTE — ED Notes (Signed)
Pt awake, pt encouraged to consume po fluids. Pt has consumed 8 ounces of water.

## 2014-11-22 NOTE — ED Provider Notes (Signed)
-----------------------------------------   6:53 AM on 11/22/2014 -----------------------------------------  [facility] I spoke with [facility] intake who states their physician has requested a hematology consult for patient's stable anemia.   Irean HongJade J Darious Rehman, MD 11/22/14 [phone]

## 2014-11-22 NOTE — ED Notes (Signed)
Report to stephanie, rn

## 2014-11-23 LAB — HAPTOGLOBIN: HAPTOGLOBIN: 42 mg/dL (ref 34–200)

## 2014-11-23 MED ORDER — METFORMIN HCL 500 MG PO TABS
ORAL_TABLET | ORAL | Status: AC
Start: 2014-11-23 — End: 2014-11-23
  Administered 2014-11-23: 500 mg via ORAL
  Filled 2014-11-23: qty 1

## 2014-11-23 MED ORDER — BUDESONIDE 0.25 MG/2ML IN SUSP
RESPIRATORY_TRACT | Status: AC
  Administered 2014-11-23: 0.5 mg
  Filled 2014-11-23: qty 4

## 2014-11-23 MED ORDER — QUETIAPINE FUMARATE 200 MG PO TABS
ORAL_TABLET | ORAL | Status: AC
  Administered 2014-11-23: 100 mg via ORAL
  Filled 2014-11-23: qty 1

## 2014-11-23 MED ORDER — FERROUS SULFATE 325 (65 FE) MG PO TABS
ORAL_TABLET | ORAL | Status: AC
  Administered 2014-11-23: 325 mg via ORAL
  Filled 2014-11-23: qty 1

## 2014-11-23 MED ORDER — METFORMIN HCL 500 MG PO TABS
ORAL_TABLET | ORAL | Status: AC
  Administered 2014-11-23: 500 mg via ORAL
  Filled 2014-11-23: qty 1

## 2014-11-23 MED ORDER — SACUBITRIL-VALSARTAN 49-51 MG PO TABS
ORAL_TABLET | ORAL | Status: AC
  Administered 2014-11-23: 1 via ORAL
  Filled 2014-11-23: qty 1

## 2014-11-23 MED ORDER — ZIPRASIDONE MESYLATE 20 MG IM SOLR
20.0000 mg | Freq: Once | INTRAMUSCULAR | Status: AC
  Administered 2014-11-23: 20 mg via INTRAMUSCULAR
  Filled 2014-11-23: qty 20

## 2014-11-23 MED ORDER — METOPROLOL TARTRATE 25 MG PO TABS
ORAL_TABLET | ORAL | Status: AC
  Administered 2014-11-23: 25 mg via ORAL
  Filled 2014-11-23: qty 1

## 2014-11-23 MED ORDER — LORATADINE 10 MG PO TABS
ORAL_TABLET | ORAL | Status: AC
  Administered 2014-11-23: 10 mg via ORAL
  Filled 2014-11-23: qty 1

## 2014-11-23 MED ORDER — IPRATROPIUM-ALBUTEROL 0.5-2.5 (3) MG/3ML IN SOLN
3.0000 mL | RESPIRATORY_TRACT | Status: DC
  Administered 2014-11-23 – 2014-11-24 (×6): 3 mL via RESPIRATORY_TRACT
  Filled 2014-11-23 (×5): qty 3

## 2014-11-23 MED ORDER — CLOPIDOGREL BISULFATE 75 MG PO TABS
ORAL_TABLET | ORAL | Status: AC
  Administered 2014-11-23: 75 mg via ORAL
  Filled 2014-11-23: qty 1

## 2014-11-23 MED ORDER — BUDESONIDE 0.5 MG/2ML IN SUSP
0.5000 mg | Freq: Two times a day (BID) | RESPIRATORY_TRACT | Status: DC
  Administered 2014-11-24: 0.5 mg via RESPIRATORY_TRACT
  Filled 2014-11-23 (×3): qty 2

## 2014-11-23 MED ORDER — PANTOPRAZOLE SODIUM 40 MG PO TBEC
DELAYED_RELEASE_TABLET | ORAL | Status: AC
  Administered 2014-11-23: 40 mg via ORAL
  Filled 2014-11-23: qty 1

## 2014-11-23 MED ORDER — IPRATROPIUM-ALBUTEROL 0.5-2.5 (3) MG/3ML IN SOLN
RESPIRATORY_TRACT | Status: AC
  Administered 2014-11-23: 3 mL via RESPIRATORY_TRACT
  Filled 2014-11-23: qty 3

## 2014-11-23 MED ORDER — VITAMIN B-1 100 MG PO TABS
ORAL_TABLET | ORAL | Status: AC
  Administered 2014-11-23: 100 mg via ORAL
  Filled 2014-11-23: qty 1

## 2014-11-23 MED ORDER — ASPIRIN EC 81 MG PO TBEC
DELAYED_RELEASE_TABLET | ORAL | Status: AC
  Administered 2014-11-23: 81 mg via ORAL
  Filled 2014-11-23: qty 1

## 2014-11-23 NOTE — ED Notes (Signed)
Pt observed lying in bed with his eyes closed  NAD assessed  Continue to monitor

## 2014-11-23 NOTE — ED Notes (Signed)
Pt had large incontinent void.  Peri care provided, sheets and clothes changed.

## 2014-11-23 NOTE — ED Notes (Signed)
BEHAVIORAL HEALTH ROUNDING Patient sleeping: No. Patient alert and oriented: oriented to self  Behavior appropriate: Yes.  ; If no, describe:  Nutrition and fluids offered: yes Toileting and hygiene offered: Yes  Sitter present: q15 minute observations and security  monitoring Law enforcement present: Yes  ODS  

## 2014-11-23 NOTE — Consult Note (Signed)
  Psychiatry: Follow-up for 69 year old man with dementia and bipolar-like symptoms. Patient interviewed. Chart reviewed. Yesterday I increased his dose of Seroquel to control agitated behavior. On interview today the patient did seem calmer. He was still awake and was slightly euphoric and talkative but was more coherent and able to stick to something like a reasonable conversation. He was not showing the same agitated movements in bed today. No sign of any side effects from medicine.  Patient continues to see psychotic delusional disoriented.  We are still waiting on appropriate placement with referral to longer term inpatient unit most likely. He has been turned down by geriatric psychiatry ward's.

## 2014-11-23 NOTE — ED Notes (Signed)
Supper provided along with an extra drink  Assistance with his meal provided  Pt observed lying in bed    Pt visualized with NAD  No verbalized needs or concerns at this time  Continue to monitor

## 2014-11-23 NOTE — Consult Note (Signed)
Hematology consult called for mild anemia prior to transfer to long-term facility. Patient's hemoglobin has improved from 8.5-9.9 over the past week without intervention. Iron stores, B12, and folate are all within normal limits. There is no evidence of hemolysis. Continue to monitor CBC. No further workup is needed at this time. Once patient is stable from a psychiatric standpoint, further evaluation can be accomplished as an outpatient.  Appreciate consult, call with questions.

## 2014-11-23 NOTE — ED Notes (Signed)
BEHAVIORAL HEALTH ROUNDING Patient sleeping: No. Patient alert and oriented:  Oriented to self Behavior appropriate: Yes.  ; If no, describe:  Nutrition and fluids offered: yes Toileting and hygiene offered: Yes  Sitter present: q15 minute observations and security camera monitoring Law enforcement present: Yes  ODS  

## 2014-11-23 NOTE — ED Provider Notes (Signed)
-----------------------------------------   3:02 PM on 11/23/2014 -----------------------------------------   Blood pressure 143/92, pulse 77, temperature 98.4 F (36.9 C), temperature source Oral, resp. rate 22, height 6\' 1"  (1.854 m), weight 220 lb (99.791 kg), SpO2 97 %.  The patient had no acute events since last update.  Calm and cooperative at this time.  Disposition is pending per Psychiatry/Behavioral Medicine team recommendations.     Arnaldo NatalPaul F Annalyssa Thune, MD 11/23/14 [phone]

## 2014-11-23 NOTE — ED Notes (Signed)
BEHAVIORAL HEALTH ROUNDING Patient sleeping: No. Patient alert and oriented: oriented to self Behavior appropriate: Yes.  ; If no, describe:  Nutrition and fluids offered: yes Toileting and hygiene offered: Yes  Sitter present: q15 minute observations and security camera monitoring Law enforcement present: Yes  ODS

## 2014-11-23 NOTE — ED Notes (Signed)

## 2014-11-23 NOTE — ED Notes (Signed)
BEHAVIORAL HEALTH ROUNDING Patient sleeping: No. Patient alert and oriented:  Oriented to self Behavior appropriate: Yes.  ; If no, describe:  Nutrition and fluids offered: yes Toileting and hygiene offered: Yes  Sitter present: q15 minute observations and security camera monitoring Law enforcement present: Yes  ODS

## 2014-11-23 NOTE — ED Notes (Signed)
ED BHU PLACEMENT JUSTIFICATION Is the patient under IVC or is there intent for IVC: Yes.   Is the patient medically cleared: Yes.   Is there vacancy in the ED BHU: Yes.   Is the population mix appropriate for patient: Yes.   Is the patient awaiting placement in inpatient or outpatient setting: Yes.   Has the patient had a psychiatric consult: Yes.   Survey of unit performed for contraband, proper placement and condition of furniture, tampering with fixtures in bathroom, shower, and each patient room: Yes.  ; Findings:  APPEARANCE/BEHAVIOR Calm and cooperative NEURO ASSESSMENT Orientation: oriented to self  Denies pain Hallucinations: No.None noted (Hallucinations) Speech: Normal  loud Gait: unsteady RESPIRATORY ASSESSMENT Even  Unlabored respirations  CARDIOVASCULAR ASSESSMENT Pulses equal   regular rate  Skin warm and dry   GASTROINTESTINAL ASSESSMENT no GI complaint EXTREMITIES Full ROM  PLAN OF CARE Provide calm/safe environment. Vital signs assessed twice daily. ED BHU Assessment once each 12-hour shift. Collaborate with intake RN daily or as condition indicates. Assure the ED provider has rounded once each shift. Provide and encourage hygiene. Provide redirection as needed. Assess for escalating behavior; address immediately and inform ED provider.  Assess family dynamic and appropriateness for visitation as needed: Yes.  ; If necessary, describe findings:  Educate the patient/family about BHU procedures/visitation: Yes.  ; If necessary, describe findings:

## 2014-11-23 NOTE — ED Notes (Signed)
Am meds administered as ordered.

## 2014-11-23 NOTE — ED Notes (Signed)
BEHAVIORAL HEALTH ROUNDING Patient sleeping: No. Patient alert and oriented: oriented to self Behavior appropriate: Yes.  ; If no, describe:  Nutrition and fluids offered: yes Toileting and hygiene offered: Yes  Sitter present: q15 minute observations and security camera monitoring Law enforcement present: Yes  ODS  

## 2014-11-23 NOTE — ED Notes (Signed)
He is lying in bed  1:1 sitter remains at bedside  NAD observed continue to monitor

## 2014-11-23 NOTE — ED Notes (Signed)
breakfast provided     he is wet - i assisted the 1:1 sitter with changing his linens and clothing  Pt requesting a cup of coffee  - decaf provided  Assessment completed  Pt continues to await placement  Social work should be involved  Continue to monitor

## 2014-11-23 NOTE — ED Notes (Signed)
Pm meds administered as ordered  

## 2014-11-23 NOTE — ED Notes (Signed)
He has closed his eyes - I provided him with a warm cover     Pt visualized with NAD  No verbalized needs or concerns at this time  Continue to monitor

## 2014-11-23 NOTE — ED Notes (Signed)

## 2014-11-23 NOTE — ED Notes (Addendum)
Lunch provided along with an extra drink - meal set up      Appropriate to stimulation  No verbalized needs or concerns at this time  NAD assessed  Continue to monitor

## 2014-11-24 LAB — GLUCOSE, CAPILLARY: GLUCOSE-CAPILLARY: 116 mg/dL — AB (ref 65–99)

## 2014-11-24 MED ORDER — METFORMIN HCL 500 MG PO TABS
ORAL_TABLET | ORAL | Status: AC
  Filled 2014-11-24: qty 1

## 2014-11-24 MED ORDER — METOPROLOL TARTRATE 25 MG PO TABS
ORAL_TABLET | ORAL | Status: AC
  Filled 2014-11-24: qty 1

## 2014-11-24 MED ORDER — CLOPIDOGREL BISULFATE 75 MG PO TABS
ORAL_TABLET | ORAL | Status: AC
  Filled 2014-11-24: qty 1

## 2014-11-24 MED ORDER — DIAZEPAM 5 MG PO TABS
10.0000 mg | ORAL_TABLET | Freq: Once | ORAL | Status: DC
Start: 2014-11-24 — End: 2014-11-24

## 2014-11-24 MED ORDER — LORATADINE 10 MG PO TABS
ORAL_TABLET | ORAL | Status: AC
  Filled 2014-11-24: qty 1

## 2014-11-24 MED ORDER — ASPIRIN 81 MG PO CHEW
CHEWABLE_TABLET | ORAL | Status: AC
  Filled 2014-11-24: qty 1

## 2014-11-24 MED ORDER — DIAZEPAM 5 MG PO TABS
ORAL_TABLET | ORAL | Status: AC
  Administered 2014-11-24: 10 mg
  Filled 2014-11-24: qty 2

## 2014-11-24 MED ORDER — VITAMIN B-1 100 MG PO TABS
ORAL_TABLET | ORAL | Status: AC
  Filled 2014-11-24: qty 1

## 2014-11-24 MED ORDER — SACUBITRIL-VALSARTAN 49-51 MG PO TABS
ORAL_TABLET | ORAL | Status: AC
  Filled 2014-11-24: qty 1

## 2014-11-24 MED ORDER — PANTOPRAZOLE SODIUM 40 MG PO TBEC
DELAYED_RELEASE_TABLET | ORAL | Status: AC
  Filled 2014-11-24: qty 1

## 2014-11-24 NOTE — ED Notes (Signed)
Pt began to escalate his behavior because he wanted to get up and leave the hospital; trying multiple times to get out of bed. He became agitated and kicked [place], NT twice during his tirade. Security personnel assisted in keeping pt from further injuring others or himself.  Pt given valium by Dr. Mayford KnifeWilliams. Refused to take it from this nurse or anyone else.

## 2014-11-24 NOTE — Consult Note (Signed)
  Psychiatry: Follow-up for this 69 year old man with agitated dementia and manic like features. Since increasing his dose of Seroquel he is a little more controllable although he still has episodes of getting agitated. He obviously gets worse when more people start coming into the room and especially seems to react badly when he sees the police. Generally however he has been cooperative. We are still working on referral to a longer term facility. No change to medication today.

## 2014-11-24 NOTE — BH Assessment (Signed)
Confirmed with [facility] ([phone]), patient remains on their Wait List, nothing else needed at this time.

## 2014-11-24 NOTE — ED Notes (Signed)
Pt awakened; sheets, linens, clothing changed as pt was wet. Pt rambling and speaking about t-shirts, drug samples. Pt calm and cooperative. Pt eating breakfast at this time. Will continue to monitor.

## 2014-11-24 NOTE — ED Notes (Signed)
Pt agitated and still frustrated. Pt wants warm chocolate chip cookies, money, and a way out of here. Pt changed into full scrubs to wear out and waiting for LE to arrive for transport.

## 2014-11-24 NOTE — BH Assessment (Signed)
[facility].  Accepted by Harmon PierJane Russim, RN  Call report to [phone].  Representative was Erskine SquibbJane  ER Staff Rivka Barbara(Glenda, ER Sect.; Dr. Derrill KayGoodman, ER MD & [facility]) have been made aware it.    Accepted by Harmon PierJane Russim, RN on the behalf of their medical provider.  RN was unable to give  MD name due to they way they are sit up. If any questions about it, was instructed to contact her supervisor, Bubba HalesJodi Webster. She states, they have confirmed by putting the nurse name, it will still be in compliance with EMTALA.

## 2014-11-24 NOTE — ED Provider Notes (Signed)
Patient is in no acute distress, no acute events overnight. He remains medically stable.  Emily FilbertJonathan E Williams, MD 11/24/14 [phone]

## 2014-11-24 NOTE — BH Assessment (Signed)
Per the request of [facility]; updated IVC, vitals, MAR & Accu-Ceck was faxed to them and confirmed it was received ([phone]).

## 2014-11-25 ENCOUNTER — Telehealth: Payer: Self-pay | Admitting: Emergency Medicine

## 2015-12-07 IMAGING — CT CT HEAD W/O CM
3 series · 16 of 30 positions shown, 17 images · non-contrast
Comparison: 03/12/2014

CLINICAL DATA: Fell, landing on his back on the floor.

EXAM:
CT HEAD WITHOUT CONTRAST
TECHNIQUE: Contiguous axial images were obtained from the base of the skull
through the vertex without intravenous contrast.

[Series 2: head bone · axial · 0.44mm/px · z∈[-190,-38]mm · 8 of 93 slices shown]
[im 11/93  bone]
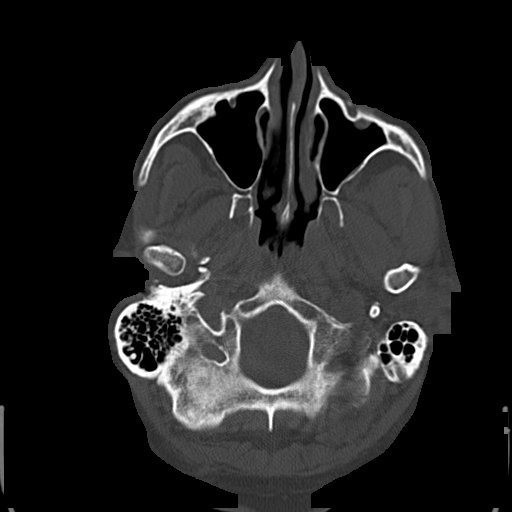
[im 22/93  bone]
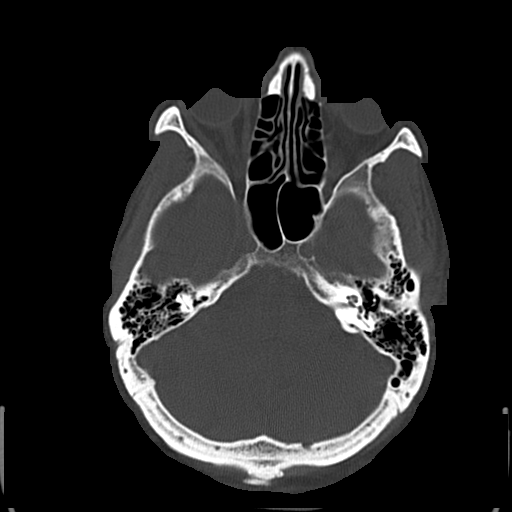
[im 33/93  bone]
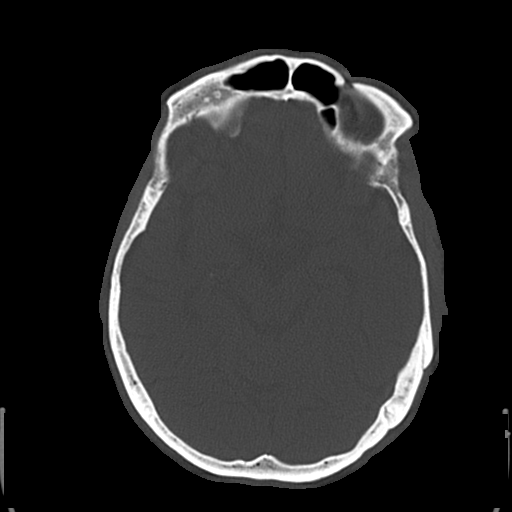
[im 44/93  bone]
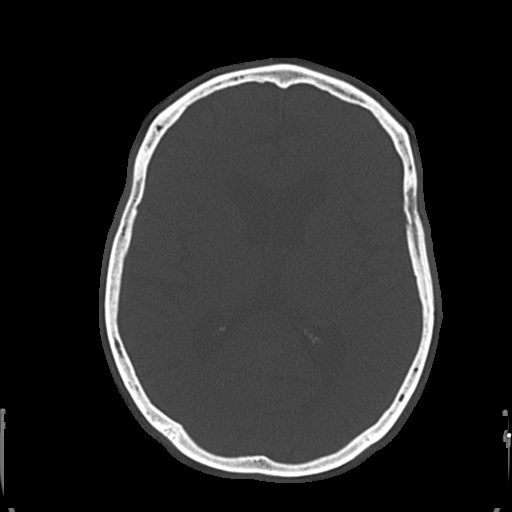
[im 55/93  bone]
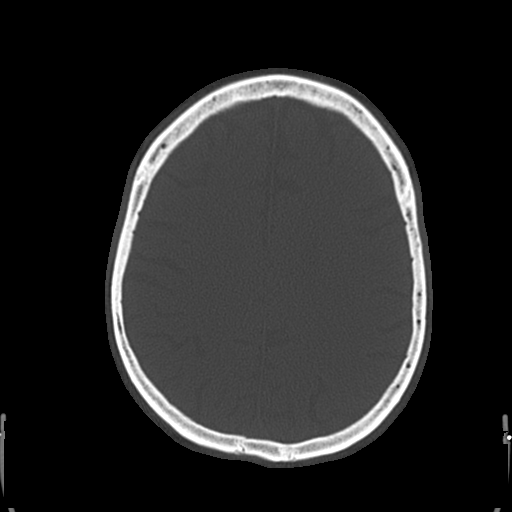
[im 65/93  bone]
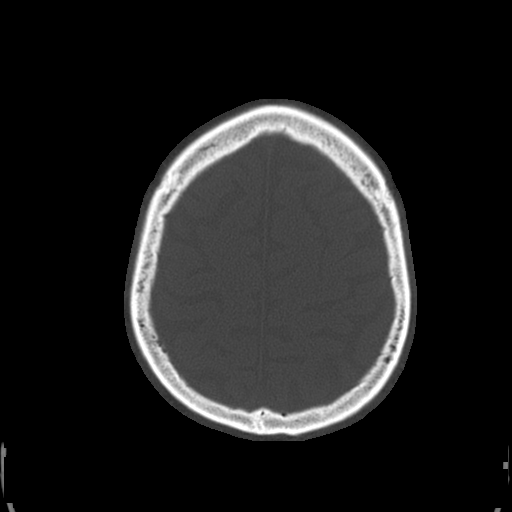
[im 76/93  bone]
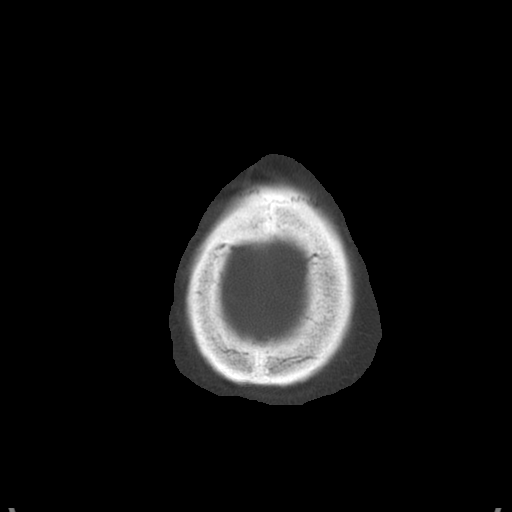
[im 87/93  bone]
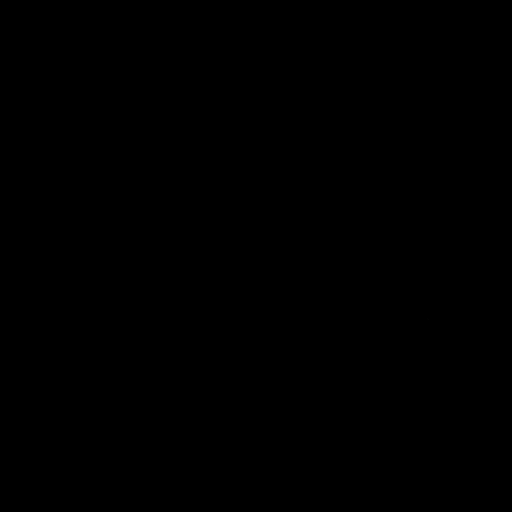

[Series 3: head wo · axial · 0.44mm/px · z∈[-171,-71]mm · 4 of 34 slices shown, 5 images]
[im 7/34  brain]
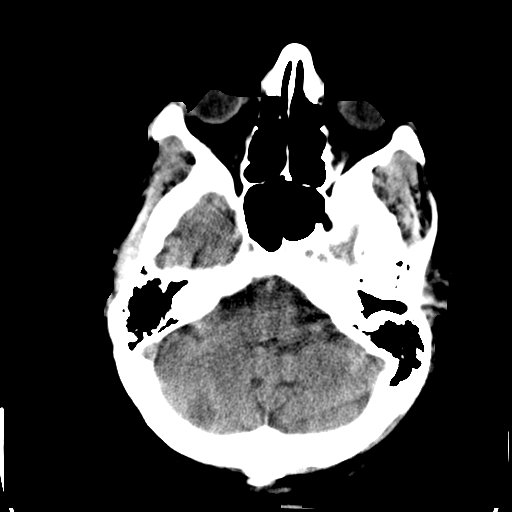
[im 7/34  bone]
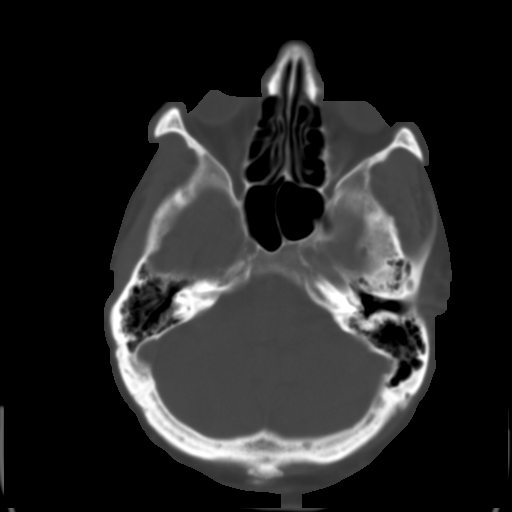
[im 14/34  brain]
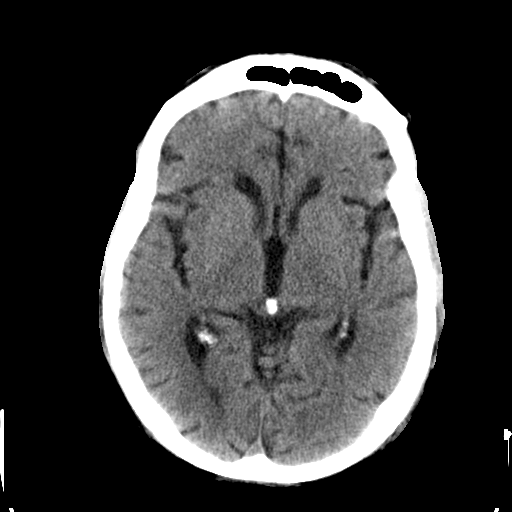
[im 20/34  brain]
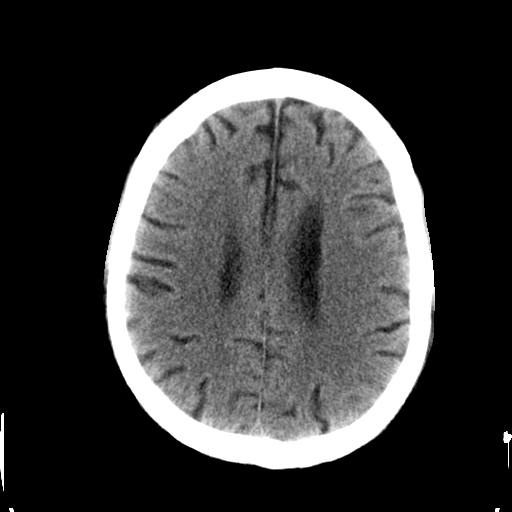
[im 27/34  brain]
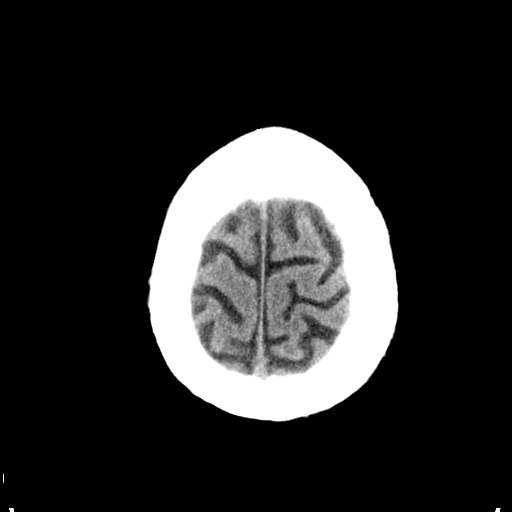

[Series 4: head wo recons · axial · 0.44mm/px · z∈[-118,-36]mm · 4 of 31 slices shown]
[im 7/31  brain]
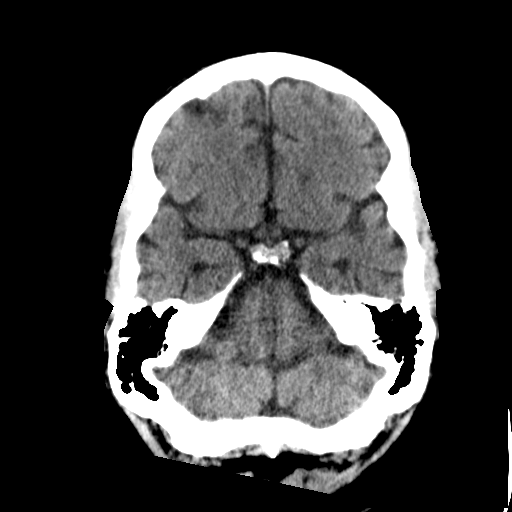
[im 13/31  brain]
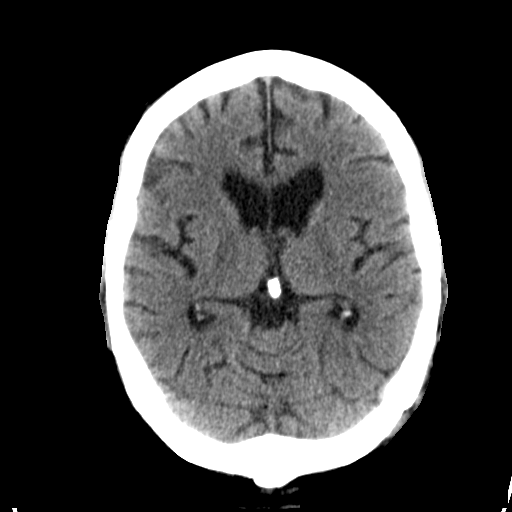
[im 19/31  brain]
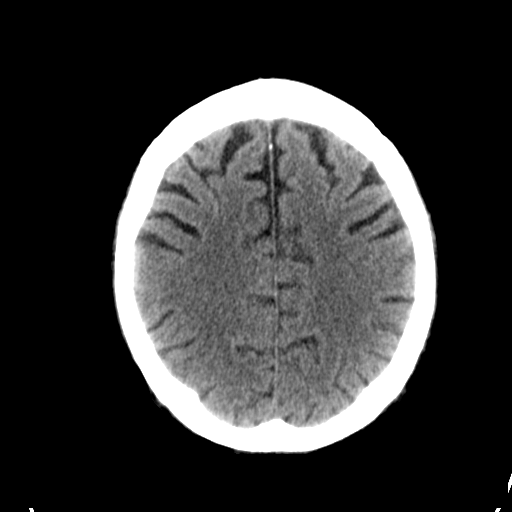
[im 25/31  brain]
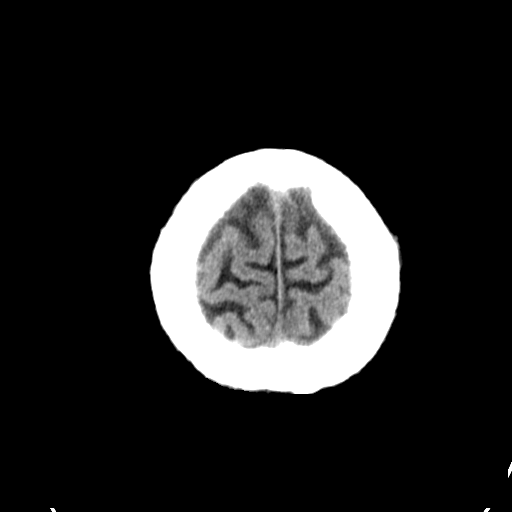

[16 of 30 positions shown; findings below may reference images not displayed]

FINDINGS: There is no intracranial hemorrhage, mass or evidence of acute
infarction. There is mild generalized atrophy. There is mild chronic
microvascular ischemic change. There is no significant extra-axial
fluid collection.

No acute intracranial findings are evident.
IMPRESSION: No acute intracranial findings. There is mild generalized atrophy
and chronic white matter changes due to small vessel disease.

## 2015-12-09 DEATH — deceased
